# Patient Record
Sex: Female | Born: 1965 | Race: Black or African American | Hispanic: No | Marital: Single | Smoking: Never smoker
Health system: Southern US, Community
[De-identification: ages and names within clinical notes are randomized; demographics above are authoritative.]

## PROBLEM LIST (undated history)

## (undated) DIAGNOSIS — Z9109 Other allergy status, other than to drugs and biological substances: Secondary | ICD-10-CM

## (undated) DIAGNOSIS — T7840XA Allergy, unspecified, initial encounter: Secondary | ICD-10-CM

## (undated) DIAGNOSIS — R51 Headache: Secondary | ICD-10-CM

## (undated) DIAGNOSIS — I10 Essential (primary) hypertension: Secondary | ICD-10-CM

## (undated) DIAGNOSIS — M729 Fibroblastic disorder, unspecified: Secondary | ICD-10-CM

## (undated) HISTORY — PX: COLONOSCOPY: SHX174

## (undated) HISTORY — DX: Essential (primary) hypertension: I10

## (undated) HISTORY — DX: Allergy, unspecified, initial encounter: T78.40XA

## (undated) HISTORY — DX: Fibroblastic disorder, unspecified: M72.9

## (undated) HISTORY — PX: BELPHAROPTOSIS REPAIR: SHX369

## (undated) HISTORY — DX: Other allergy status, other than to drugs and biological substances: Z91.09

## (undated) HISTORY — PX: KNEE SURGERY: SHX244

## (undated) HISTORY — DX: Headache: R51

---

## 1997-08-15 ENCOUNTER — Ambulatory Visit (HOSPITAL_COMMUNITY): Admission: RE | Admit: 1997-08-15 | Discharge: 1997-08-15 | Payer: Self-pay | Admitting: Gynecology

## 1998-05-05 ENCOUNTER — Other Ambulatory Visit: Admission: RE | Admit: 1998-05-05 | Discharge: 1998-05-05 | Payer: Self-pay | Admitting: Gynecology

## 1999-07-22 ENCOUNTER — Other Ambulatory Visit: Admission: RE | Admit: 1999-07-22 | Discharge: 1999-07-22 | Payer: Self-pay | Admitting: Gynecology

## 2000-07-24 ENCOUNTER — Other Ambulatory Visit: Admission: RE | Admit: 2000-07-24 | Discharge: 2000-07-24 | Payer: Self-pay | Admitting: Gynecology

## 2001-01-16 ENCOUNTER — Encounter: Payer: Self-pay | Admitting: Occupational Medicine

## 2001-01-16 ENCOUNTER — Encounter: Admission: RE | Admit: 2001-01-16 | Discharge: 2001-01-16 | Payer: Self-pay | Admitting: Occupational Medicine

## 2001-08-14 ENCOUNTER — Other Ambulatory Visit: Admission: RE | Admit: 2001-08-14 | Discharge: 2001-08-14 | Payer: Self-pay | Admitting: Gynecology

## 2002-08-20 ENCOUNTER — Other Ambulatory Visit: Admission: RE | Admit: 2002-08-20 | Discharge: 2002-08-20 | Payer: Self-pay | Admitting: Gynecology

## 2003-10-23 ENCOUNTER — Other Ambulatory Visit: Admission: RE | Admit: 2003-10-23 | Discharge: 2003-10-23 | Payer: Self-pay | Admitting: Gynecology

## 2004-08-06 ENCOUNTER — Ambulatory Visit: Payer: Self-pay | Admitting: Family Medicine

## 2004-08-13 ENCOUNTER — Ambulatory Visit: Payer: Self-pay | Admitting: Family Medicine

## 2004-08-18 ENCOUNTER — Encounter: Admission: RE | Admit: 2004-08-18 | Discharge: 2004-08-18 | Payer: Self-pay | Admitting: Occupational Medicine

## 2004-11-15 ENCOUNTER — Other Ambulatory Visit: Admission: RE | Admit: 2004-11-15 | Discharge: 2004-11-15 | Payer: Self-pay | Admitting: Gynecology

## 2004-12-06 ENCOUNTER — Ambulatory Visit: Payer: Self-pay | Admitting: Family Medicine

## 2005-10-07 ENCOUNTER — Ambulatory Visit: Payer: Self-pay | Admitting: Family Medicine

## 2005-10-13 ENCOUNTER — Ambulatory Visit: Payer: Self-pay | Admitting: Family Medicine

## 2005-10-31 ENCOUNTER — Encounter: Admission: RE | Admit: 2005-10-31 | Discharge: 2005-10-31 | Payer: Self-pay | Admitting: Occupational Medicine

## 2007-02-08 ENCOUNTER — Ambulatory Visit: Payer: Self-pay | Admitting: Family Medicine

## 2007-02-08 DIAGNOSIS — N6019 Diffuse cystic mastopathy of unspecified breast: Secondary | ICD-10-CM | POA: Insufficient documentation

## 2007-02-19 ENCOUNTER — Encounter: Admission: RE | Admit: 2007-02-19 | Discharge: 2007-02-19 | Payer: Self-pay | Admitting: Family Medicine

## 2007-10-02 ENCOUNTER — Emergency Department (HOSPITAL_COMMUNITY): Admission: EM | Admit: 2007-10-02 | Discharge: 2007-10-02 | Payer: Self-pay | Admitting: Emergency Medicine

## 2008-01-16 ENCOUNTER — Ambulatory Visit: Payer: Self-pay | Admitting: Family Medicine

## 2008-01-16 LAB — CONVERTED CEMR LAB
Albumin: 3.7 g/dL (ref 3.5–5.2)
BUN: 8 mg/dL (ref 6–23)
Calcium: 9 mg/dL (ref 8.4–10.5)
Eosinophils Absolute: 0.2 10*3/uL (ref 0.0–0.7)
Eosinophils Relative: 2.7 % (ref 0.0–5.0)
GFR calc Af Amer: 118 mL/min
GFR calc non Af Amer: 98 mL/min
Glucose, Bld: 86 mg/dL (ref 70–99)
Glucose, Urine, Semiquant: NEGATIVE
HCT: 38.3 % (ref 36.0–46.0)
HDL: 57.8 mg/dL (ref >39.0)
Hemoglobin: 12.7 g/dL (ref 12.0–15.0)
Ketones, urine, test strip: NEGATIVE
MCV: 86.8 fL (ref 78.0–100.0)
Monocytes Absolute: 0.8 10*3/uL (ref 0.1–1.0)
Monocytes Relative: 10.8 % (ref 3.0–12.0)
Neutro Abs: 4.3 10*3/uL (ref 1.4–7.7)
Nitrite: NEGATIVE
RDW: 13.3 % (ref 11.5–14.6)
Specific Gravity, Urine: >=1.03
TSH: 1.68 microintl units/mL (ref 0.35–5.50)
Total CHOL/HDL Ratio: 2.8
Total Protein: 7.3 g/dL (ref 6.0–8.3)
Triglycerides: 44 mg/dL (ref 0–149)
WBC: 7.4 10*3/uL (ref 4.5–10.5)
pH: 5.5

## 2008-01-22 ENCOUNTER — Ambulatory Visit: Payer: Self-pay | Admitting: Family Medicine

## 2009-01-15 ENCOUNTER — Ambulatory Visit: Payer: Self-pay | Admitting: Family Medicine

## 2009-01-15 LAB — CONVERTED CEMR LAB
ALT: 13 units/L (ref 0–35)
BUN: 10 mg/dL (ref 6–23)
Basophils Relative: 0.4 % (ref 0.0–3.0)
Bilirubin Urine: NEGATIVE
CO2: 28 meq/L (ref 19–32)
Chloride: 107 meq/L (ref 96–112)
Cholesterol: 162 mg/dL (ref 0–200)
Eosinophils Relative: 2.9 % (ref 0.0–5.0)
Glucose, Urine, Semiquant: NEGATIVE
HCT: 38.6 % (ref 36.0–46.0)
LDL Cholesterol: 100 mg/dL — ABNORMAL HIGH (ref 0–99)
Lymphs Abs: 1.8 10*3/uL (ref 0.7–4.0)
MCV: 86.9 fL (ref 78.0–100.0)
Monocytes Absolute: 0.6 10*3/uL (ref 0.1–1.0)
Platelets: 266 10*3/uL (ref 150.0–400.0)
Potassium: 4.1 meq/L (ref 3.5–5.1)
Specific Gravity, Urine: 1.02
TSH: 1.59 microintl units/mL (ref 0.35–5.50)
Total Bilirubin: 0.9 mg/dL (ref 0.3–1.2)
Total Protein: 6.8 g/dL (ref 6.0–8.3)
Urobilinogen, UA: 1
WBC: 6.7 10*3/uL (ref 4.5–10.5)
pH: 7

## 2009-02-16 ENCOUNTER — Ambulatory Visit: Payer: Self-pay | Admitting: Family Medicine

## 2009-03-16 ENCOUNTER — Encounter: Admission: RE | Admit: 2009-03-16 | Discharge: 2009-03-16 | Payer: Self-pay | Admitting: Gynecology

## 2009-06-12 ENCOUNTER — Emergency Department (HOSPITAL_COMMUNITY): Admission: EM | Admit: 2009-06-12 | Discharge: 2009-06-12 | Payer: Self-pay | Admitting: Emergency Medicine

## 2010-02-11 ENCOUNTER — Ambulatory Visit: Payer: Self-pay | Admitting: Family Medicine

## 2010-02-11 LAB — CONVERTED CEMR LAB
AST: 20 units/L (ref 0–37)
Albumin: 3.7 g/dL (ref 3.5–5.2)
Basophils Absolute: 0.1 10*3/uL (ref 0.0–0.1)
CO2: 26 meq/L (ref 19–32)
GFR calc non Af Amer: 107.54 mL/min (ref >60)
Glucose, Bld: 85 mg/dL (ref 70–99)
Glucose, Urine, Semiquant: NEGATIVE
HCT: 35.9 % — ABNORMAL LOW (ref 36.0–46.0)
Hemoglobin: 11.8 g/dL — ABNORMAL LOW (ref 12.0–15.0)
Lymphs Abs: 2.5 10*3/uL (ref 0.7–4.0)
MCHC: 32.9 g/dL (ref 30.0–36.0)
Monocytes Relative: 10.3 % (ref 3.0–12.0)
Neutro Abs: 3.2 10*3/uL (ref 1.4–7.7)
Potassium: 3.8 meq/L (ref 3.5–5.1)
RDW: 13.8 % (ref 11.5–14.6)
Sodium: 136 meq/L (ref 135–145)
Specific Gravity, Urine: 1.02
TSH: 1.9 microintl units/mL (ref 0.35–5.50)
Total Protein: 6.8 g/dL (ref 6.0–8.3)
WBC Urine, dipstick: NEGATIVE
pH: 7

## 2010-02-23 ENCOUNTER — Encounter: Payer: Self-pay | Admitting: Family Medicine

## 2010-02-23 ENCOUNTER — Ambulatory Visit: Payer: Self-pay | Admitting: Family Medicine

## 2010-02-23 DIAGNOSIS — R030 Elevated blood-pressure reading, without diagnosis of hypertension: Secondary | ICD-10-CM | POA: Insufficient documentation

## 2010-03-10 ENCOUNTER — Ambulatory Visit: Payer: Self-pay | Admitting: Family Medicine

## 2010-04-18 ENCOUNTER — Encounter: Payer: Self-pay | Admitting: Gynecology

## 2010-04-18 ENCOUNTER — Encounter: Payer: Self-pay | Admitting: Occupational Medicine

## 2010-04-27 NOTE — Assessment & Plan Note (Signed)
Summary: CPX/NO PAP/CJR   Vital Signs:  Patient profile:   45 year old female Height:      65.5 inches Weight:      155 pounds BMI:     25.49 Temp:     98.3 degrees F oral BP sitting:   140 / 90  (left arm) Cuff size:   regular  Vitals Entered By: Kern Reap CMA Duncan Dull) (February 23, 2010 4:27 PM)  History of Present Illness: Carla Jacobson is a 45 year old, married female, nonsmoker, who comes in today for physical evaluation.  She has a number of concerns today, including her blood pressure.................. BP at home 140/90... BP by me 140/70 right arm sitting position, however, her mother and father both have hypertension.  She has intermittent migraine headaches.  She's concerned about her weight 155 pounds.  She wants to know about, calcium, vitamin D, and aspirin.  She has some tendinitis in her right arm and some low back pain.  Review of systems otherwise negative By GYN.  A month ago, normal.  Annual mammogram.  Normal 2010  Tetanus 2006 seasonal flu shot 2011  Allergies: No Known Drug Allergies  Review of Systems      See HPI  Physical Exam  General:  Well-developed,well-nourished,in no acute distress; alert,appropriate and cooperative throughout examination Head:  Normocephalic and atraumatic without obvious abnormalities. No apparent alopecia or balding. Eyes:  No corneal or conjunctival inflammation noted. EOMI. Perrla. Funduscopic exam benign, without hemorrhages, exudates or papilledema. Vision grossly normal. Ears:  External ear exam shows no significant lesions or deformities.  Otoscopic examination reveals clear canals, tympanic membranes are intact bilaterally without bulging, retraction, inflammation or discharge. Hearing is grossly normal bilaterally. Nose:  External nasal examination shows no deformity or inflammation. Nasal mucosa are pink and moist without lesions or exudates. Mouth:  Oral mucosa and oropharynx without lesions or exudates.  Teeth in good  repair. Neck:  No deformities, masses, or tenderness noted. Chest Wall:  No deformities, masses, or tenderness noted. Breasts:  No mass, nodules, thickening, tenderness, bulging, retraction, inflamation, nipple discharge or skin changes noted.   Lungs:  Normal respiratory effort, chest expands symmetrically. Lungs are clear to auscultation, no crackles or wheezes. Heart:  Normal rate and regular rhythm. S1 and S2 normal without gallop, murmur, click, rub or other extra sounds. Abdomen:  Bowel sounds positive,abdomen soft and non-tender without masses, organomegaly or hernias noted. Msk:  No deformity or scoliosis noted of thoracic or lumbar spine.   Pulses:  R and L carotid,radial,femoral,dorsalis pedis and posterior tibial pulses are full and equal bilaterally Extremities:  No clubbing, cyanosis, edema, or deformity noted with normal full range of motion of all joints.   Neurologic:  No cranial nerve deficits noted. Station and gait are normal. Plantar reflexes are down-going bilaterally. DTRs are symmetrical throughout. Sensory, motor and coordinative functions appear intact. Skin:  Intact without suspicious lesions or rashes Cervical Nodes:  No lymphadenopathy noted Axillary Nodes:  No palpable lymphadenopathy Inguinal Nodes:  No significant adenopathy Psych:  Cognition and judgment appear intact. Alert and cooperative with normal attention span and concentration. No apparent delusions, illusions, hallucinations   Impression & Recommendations:  Problem # 1:  HEALTH SCREENING (ICD-V70.0) Assessment Unchanged  Orders: EKG w/ Interpretation (93000)  Problem # 2:  FIBROCYSTIC BREAST DISEASE (ICD-610.1) Assessment: Unchanged  Problem # 3:  ELEVATED BLOOD PRESSURE WITHOUT DIAGNOSIS OF HYPERTENSION (ICD-796.2) Assessment: New  Orders: EKG w/ Interpretation (93000)  Patient Instructions: 1)  take Motrin, 600 mg 3  times a day for your headaches, and/or D. tendinitis on her right elbow  and low back pain. 2)   salt free diet.!!!!!!!!!!!!!!!! 3)  Check a morning blood pressure daily.  Return in two weeks with the data and the device. 4)  Walk 30 minutes daily. 5)  Schedule your mammogram./BSE monthly 6)  Take an Aspirin every day.   Orders Added: 1)  Est. Patient 40-64 years [99396] 2)  EKG w/ Interpretation [93000]   Immunization History:  Influenza Immunization History:    Influenza:  historical (12/26/2009)   Immunization History:  Influenza Immunization History:    Influenza:  Historical (12/26/2009)
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
## 2010-06-20 LAB — URINALYSIS, ROUTINE W REFLEX MICROSCOPIC
Bilirubin Urine: NEGATIVE
Ketones, ur: NEGATIVE mg/dL
Nitrite: NEGATIVE
Protein, ur: NEGATIVE mg/dL
Specific Gravity, Urine: 1.01 (ref 1.005–1.030)
Urobilinogen, UA: 0.2 mg/dL (ref 0.0–1.0)

## 2010-06-20 LAB — BASIC METABOLIC PANEL
BUN: 9 mg/dL (ref 6–23)
CO2: 23 mEq/L (ref 19–32)
Chloride: 106 mEq/L (ref 96–112)
Glucose, Bld: 147 mg/dL — ABNORMAL HIGH (ref 70–99)
Potassium: 3.3 mEq/L — ABNORMAL LOW (ref 3.5–5.1)

## 2010-06-20 LAB — CBC
HCT: 40.7 % (ref 36.0–46.0)
Hemoglobin: 13.1 g/dL (ref 12.0–15.0)
MCHC: 32.2 g/dL (ref 30.0–36.0)
MCV: 86.4 fL (ref 78.0–100.0)
Platelets: 270 10*3/uL (ref 150–400)
RDW: 13.9 % (ref 11.5–15.5)

## 2010-06-20 LAB — DIFFERENTIAL
Basophils Absolute: 0.1 10*3/uL (ref 0.0–0.1)
Eosinophils Absolute: 0.1 10*3/uL (ref 0.0–0.7)
Eosinophils Relative: 1 % (ref 0–5)
Monocytes Absolute: 0.6 10*3/uL (ref 0.1–1.0)

## 2010-06-20 LAB — POCT PREGNANCY, URINE: Preg Test, Ur: NEGATIVE

## 2010-06-20 LAB — URINE MICROSCOPIC-ADD ON

## 2010-12-23 LAB — URINALYSIS, ROUTINE W REFLEX MICROSCOPIC
Bilirubin Urine: NEGATIVE
Glucose, UA: NEGATIVE
Protein, ur: NEGATIVE
Urobilinogen, UA: 1

## 2010-12-23 LAB — URINE MICROSCOPIC-ADD ON

## 2011-11-24 ENCOUNTER — Other Ambulatory Visit: Payer: Self-pay | Admitting: Gynecology

## 2011-11-24 DIAGNOSIS — Z1231 Encounter for screening mammogram for malignant neoplasm of breast: Secondary | ICD-10-CM

## 2011-12-13 ENCOUNTER — Ambulatory Visit
Admission: RE | Admit: 2011-12-13 | Discharge: 2011-12-13 | Disposition: A | Discharge status: Home / Self Care | Payer: Federal, State, Local not specified - PPO | Source: Ambulatory Visit | Attending: Gynecology | Admitting: Gynecology

## 2011-12-13 DIAGNOSIS — Z1231 Encounter for screening mammogram for malignant neoplasm of breast: Secondary | ICD-10-CM

## 2012-03-16 ENCOUNTER — Other Ambulatory Visit (INDEPENDENT_AMBULATORY_CARE_PROVIDER_SITE_OTHER): Payer: Federal, State, Local not specified - PPO

## 2012-03-16 ENCOUNTER — Ambulatory Visit (INDEPENDENT_AMBULATORY_CARE_PROVIDER_SITE_OTHER): Payer: Federal, State, Local not specified - PPO | Admitting: *Deleted

## 2012-03-16 ENCOUNTER — Other Ambulatory Visit: Payer: Federal, State, Local not specified - PPO

## 2012-03-16 DIAGNOSIS — Z0000 Encounter for general adult medical examination without abnormal findings: Secondary | ICD-10-CM

## 2012-03-16 DIAGNOSIS — Z23 Encounter for immunization: Secondary | ICD-10-CM

## 2012-03-16 LAB — CBC WITH DIFFERENTIAL/PLATELET
Basophils Relative: 0.5 % (ref 0.0–3.0)
Eosinophils Absolute: 0.3 10*3/uL (ref 0.0–0.7)
Eosinophils Relative: 4 % (ref 0.0–5.0)
HCT: 38.5 % (ref 36.0–46.0)
Lymphs Abs: 2.4 10*3/uL (ref 0.7–4.0)
MCHC: 32.9 g/dL (ref 30.0–36.0)
MCV: 85.2 fl (ref 78.0–100.0)
Monocytes Absolute: 0.8 10*3/uL (ref 0.1–1.0)
Neutro Abs: 4.7 10*3/uL (ref 1.4–7.7)
Neutrophils Relative %: 56.1 % (ref 43.0–77.0)
RBC: 4.52 Mil/uL (ref 3.87–5.11)
WBC: 8.3 10*3/uL (ref 4.5–10.5)

## 2012-03-16 LAB — TSH: TSH: 1.65 u[IU]/mL (ref 0.35–5.50)

## 2012-03-16 LAB — BASIC METABOLIC PANEL
CO2: 26 mEq/L (ref 19–32)
Chloride: 107 mEq/L (ref 96–112)
Creatinine, Ser: 0.8 mg/dL (ref 0.4–1.2)
Potassium: 3.8 mEq/L (ref 3.5–5.1)

## 2012-03-16 LAB — POCT URINALYSIS DIPSTICK
Ketones, UA: NEGATIVE
Leukocytes, UA: NEGATIVE
Nitrite, UA: NEGATIVE
Protein, UA: NEGATIVE
Urobilinogen, UA: 0.2
pH, UA: 7.5

## 2012-03-16 LAB — HEPATIC FUNCTION PANEL
ALT: 13 U/L (ref 0–35)
Albumin: 3.8 g/dL (ref 3.5–5.2)
Total Protein: 7.1 g/dL (ref 6.0–8.3)

## 2012-03-16 LAB — LIPID PANEL
Cholesterol: 156 mg/dL (ref 0–200)
VLDL: 7.2 mg/dL (ref 0.0–40.0)

## 2012-03-26 ENCOUNTER — Encounter: Payer: Federal, State, Local not specified - PPO | Admitting: Family Medicine

## 2012-04-02 ENCOUNTER — Encounter: Payer: Federal, State, Local not specified - PPO | Admitting: Family Medicine

## 2012-04-12 ENCOUNTER — Encounter: Payer: Self-pay | Admitting: Family Medicine

## 2012-04-12 ENCOUNTER — Ambulatory Visit (INDEPENDENT_AMBULATORY_CARE_PROVIDER_SITE_OTHER): Payer: Federal, State, Local not specified - PPO | Admitting: Family Medicine

## 2012-04-12 VITALS — BP 120/88 | Temp 98.4°F | Ht 66.5 in | Wt 165.0 lb

## 2012-04-12 DIAGNOSIS — N6019 Diffuse cystic mastopathy of unspecified breast: Secondary | ICD-10-CM

## 2012-04-12 DIAGNOSIS — N959 Unspecified menopausal and perimenopausal disorder: Secondary | ICD-10-CM

## 2012-04-12 DIAGNOSIS — Z8371 Family history of colonic polyps: Secondary | ICD-10-CM

## 2012-04-12 DIAGNOSIS — M722 Plantar fascial fibromatosis: Secondary | ICD-10-CM

## 2012-04-12 NOTE — Progress Notes (Signed)
  Subjective:    Patient ID: Carla Jacobson, female    DOB: [birth year], 47 y.o.   MRN: [id number]  HPI Carla Jacobson is a 47 year old married female nonsmoker who comes in today for general physical examination  She's always been in excellent health he has no chronic health problems and takes no medication on a regular basis. LMP was in December normal she does have some perimenopausal symptoms with hot flashes. For birth control but using condoms.  She's had some pain in the ventral portion of her left foot for about 4 weeks. No history of trauma  She gets routine eye care, dental care, and you mammography, does not do BSE monthly, colonoscopy should be done now because her mother had a history of colon polyps.  Social history she has worked for the [employer] They're closing the [place] office and moving to [facility] or [place]   Review of Systems  Constitutional: Negative.   HENT: Negative.   Eyes: Negative.   Respiratory: Negative.   Cardiovascular: Negative.   Gastrointestinal: Negative.   Genitourinary: Negative.   Musculoskeletal: Negative.   Neurological: Negative.   Hematological: Negative.   Psychiatric/Behavioral: Negative.        Objective:   Physical Exam  Constitutional: She appears well-developed and well-nourished.  HENT:  Head: Normocephalic and atraumatic.  Right Ear: External ear normal.  Left Ear: External ear normal.  Nose: Nose normal.  Mouth/Throat: Oropharynx is clear and moist.  Eyes: EOM are normal. Pupils are equal, round, and reactive to light.  Neck: Normal range of motion. Neck supple. No thyromegaly present.  Cardiovascular: Normal rate, regular rhythm, normal heart sounds and intact distal pulses.  Exam reveals no gallop and no friction rub.   No murmur heard. Pulmonary/Chest: Effort normal and breath sounds normal.  Abdominal: Soft. Bowel sounds are normal. She exhibits no distension and no mass. There is no  tenderness. There is no rebound.  Genitourinary:       Bilateral breast exam normal,,,,,,, she has multiple fibrocystic changes throughout both breasts advised and showed how to do BSE monthly  Musculoskeletal: Normal range of motion.  Lymphadenopathy:    She has no cervical adenopathy.  Neurological: She is alert. She has normal reflexes. No cranial nerve deficit. She exhibits normal muscle tone. Coordination normal.  Skin: Skin is warm and dry.  Psychiatric: She has a normal mood and affect. Her behavior is normal. Judgment and thought content normal.          Assessment & Plan:  Healthy female  Fibrocystic breast changes recommended BSE monthly and annual mammography  Perimenopausal followed by GYN  Plantar fasciitis left foot Motrin 600 twice a day with food

## 2012-04-12 NOTE — Patient Instructions (Addendum)
Motrin 600 mg twice daily with food and cotton socks and sneakers for the fasciitis of your left foot  Followup in one year for general physical examination sooner if any problems  Do a thorough breast exam monthly as outlined  You will get a call from GI to review how a colonoscopy is done

## 2012-04-17 ENCOUNTER — Encounter (HOSPITAL_COMMUNITY): Payer: Self-pay | Admitting: Emergency Medicine

## 2012-04-17 ENCOUNTER — Emergency Department (HOSPITAL_COMMUNITY)
Admission: EM | Admit: 2012-04-17 | Discharge: 2012-04-17 | Disposition: A | Discharge status: Home / Self Care | Payer: Federal, State, Local not specified - PPO | Attending: Emergency Medicine | Admitting: Emergency Medicine

## 2012-04-17 DIAGNOSIS — M436 Torticollis: Secondary | ICD-10-CM

## 2012-04-17 DIAGNOSIS — M542 Cervicalgia: Secondary | ICD-10-CM | POA: Insufficient documentation

## 2012-04-17 MED ORDER — NAPROXEN 500 MG PO TABS: 500.0000 mg | ORAL_TABLET | Freq: Two times a day (BID) | ORAL | Status: DC

## 2012-04-17 MED ORDER — HYDROCODONE-ACETAMINOPHEN 5-325 MG PO TABS: ORAL_TABLET | ORAL | Status: DC

## 2012-04-17 MED ORDER — KETOROLAC TROMETHAMINE 60 MG/2ML IM SOLN
60.0000 mg | Freq: Once | INTRAMUSCULAR | Status: AC
  Administered 2012-04-17: 60 mg via INTRAMUSCULAR
  Filled 2012-04-17: qty 2

## 2012-04-17 MED ORDER — METHOCARBAMOL 500 MG PO TABS: 1000.0000 mg | ORAL_TABLET | Freq: Four times a day (QID) | ORAL | Status: DC
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
## 2012-04-17 NOTE — ED Notes (Signed)
Pt reports "stiff neck" x 3 days, unresponsive to OTC meds or heat. Denies trauma

## 2012-04-17 NOTE — ED Provider Notes (Signed)
Medical screening examination/treatment/procedure(s) were performed by non-physician practitioner and as supervising physician I was immediately available for consultation/collaboration.  Gerhard Munch, MD 04/17/12 1651

## 2012-05-15 ENCOUNTER — Encounter: Payer: Federal, State, Local not specified - PPO | Admitting: Family Medicine

## 2012-07-24 ENCOUNTER — Encounter: Payer: Self-pay | Admitting: Family Medicine

## 2012-07-24 ENCOUNTER — Ambulatory Visit (INDEPENDENT_AMBULATORY_CARE_PROVIDER_SITE_OTHER): Payer: Federal, State, Local not specified - PPO | Admitting: Family Medicine

## 2012-07-24 ENCOUNTER — Telehealth: Payer: Self-pay | Admitting: Family Medicine

## 2012-07-24 VITALS — BP 140/90 | Temp 98.4°F | Wt 164.0 lb

## 2012-07-24 DIAGNOSIS — O10019 Pre-existing essential hypertension complicating pregnancy, unspecified trimester: Secondary | ICD-10-CM | POA: Insufficient documentation

## 2012-07-24 MED ORDER — CHLORTHALIDONE 25 MG PO TABS: 25.0000 mg | ORAL_TABLET | Freq: Every day | ORAL | Status: DC

## 2012-07-24 NOTE — Progress Notes (Signed)
  Subjective:    Patient ID: Carla Jacobson, female    DOB: September 22, 1965, 47 y.o.   MRN: [id number]  HPI Carla Jacobson is a 47 year old married female nonsmoker who comes in today for followup of elevated blood pressure  She's had episodes in the past for her blood pressures, but has never been sustained. Recently she's been checking her BP daily for the past month and are all elevated.  Her mother father and brother all have hypertension   Review of Systems    review of systems negative Objective:   Physical Exam Well-developed well-nourished female no acute distress BP right arm sitting position 150/90       Assessment & Plan:  New onset hypertension plan begin a diuretic BP check daily followup in

## 2012-07-24 NOTE — Patient Instructions (Addendum)
Begin D. Chlorthalidone 25 mg,,,,,,,,,,,,, one tablet daily in the morning  Salt free diet  Walk 30 minutes daily  Check your blood pressure daily in the morning right arm sitting position  Return in 4 weeks for followup

## 2012-07-24 NOTE — Telephone Encounter (Signed)
Patient Information:  Caller Name: Keondra  Phone: [phone]  Patient: Carla Jacobson, Carla Jacobson  Gender: Female  DOB: Jan 27, 1966  Age: 47 Years  PCP: Kelle Darting [facility])  Pregnant: No  Office Follow Up:  Does the office need to follow up with this patient?: No  Instructions For The Office: N/A   Symptoms  Reason For Call & Symptoms: Patient states she having issues with her blood pressure. Onset in March , 2014. Blood pressure 150/97 today.    In the last week- 165/105.   Her father has history of HTN/stroke.  Reviewed Health History In EMR: Yes  Reviewed Medications In EMR: Yes  Reviewed Allergies In EMR: Yes  Reviewed Surgeries / Procedures: Yes  Date of Onset of Symptoms: 06/12/2012 OB / GYN:  LMP: Unknown  Guideline(s) Used:  High Blood Pressure  Disposition Per Guideline:   See Within 2 Weeks in Office  Reason For Disposition Reached:   BP > 140/90 and is not taking BP medications  Advice Given:  General:  Untreated high blood pressure may cause damage to the heart, brain, kidneys, and eyes.  Treatment of high blood pressure can reduce the risk of stroke, heart attack, and heart failure.  The goal of blood pressure treatment for most patients with hypertension is to keep the blood pressure under 140/90.  BP less than 120 / 80   This is considered normal blood pressure  Lifestyle Changes  Maintain a healthy weight. Lose weight if you are overweight.  Do 30 minutes of aerobic physical activity (e.g., brisk walking) most days of the week.  Eat a diet high in fresh fruits and low-fat dairy products. Limit your intake of saturated and total fat. Choose foods that are lower in salt.  If you smoke, you should stop.  If you drink alcohol, you should limit your daily alcohol drinking. Women should have no more than one drink per day. Men should have no more than 2 drinks per day. A drink is defined as 1.5 oz hard liquor (one shot or jigger; 45 ml), 5 oz wine (small  glass; 150 ml), or 12 oz beer (one can; 360 ml).  Call Back If:  Headache, blurred vision, difficulty talking, or difficulty walking occurs  Chest pain or difficulty breathing occurs  You want to go in to the office for a blood pressure check  You become worse.  Patient Will Follow Care Advice:  YES  Appointment Scheduled:  07/24/2012 16:30:00 Appointment Scheduled Provider:  Kelle Darting [facility])

## 2012-08-23 ENCOUNTER — Encounter: Payer: Self-pay | Admitting: *Deleted

## 2012-08-23 ENCOUNTER — Encounter: Payer: Self-pay | Admitting: Family Medicine

## 2012-08-23 ENCOUNTER — Ambulatory Visit (INDEPENDENT_AMBULATORY_CARE_PROVIDER_SITE_OTHER): Payer: Federal, State, Local not specified - PPO | Admitting: Family Medicine

## 2012-08-23 VITALS — BP 120/90 | Temp 98.3°F | Wt 164.0 lb

## 2012-08-23 DIAGNOSIS — I10 Essential (primary) hypertension: Secondary | ICD-10-CM

## 2012-08-23 NOTE — Patient Instructions (Signed)
Continue the chlorthalidone 25 mg,,,,,,,,,,, one tablet daily in the morning  Check your blood pressure weekly visits back to normal  If per chance she get an elevated reading then check it daily for 2 weeks in a row  If after 2 weeks u  Look and  your blood pressures are all normal then continue to one tablet daily  If after 2 weeks her blood pressures are not normal,,,,,,,,,,, 135/85 or less,,,,,,,,, them return to see [place] with the data and the device for reevaluation  Return next November for your annual physical examination labs one week prior

## 2012-08-23 NOTE — Progress Notes (Signed)
  Subjective:    Patient ID: Carla Jacobson, female    DOB: 1965/08/03, 47 y.o.   MRN: [id number]  HPI Carla Jacobson is a delightful 47 year old female married nonsmoker who comes in today for followup of hypertension  We started her on chlorthalidone 25 mg daily BP 120/80 right arm sitting position same at home and digital cuff   Review of Systems Review of systems otherwise negative no side effects to medication    Objective:   Physical Exam Well-developed well-nourished female no acute distress BP right arm sitting position 130/80 pulse 70 and regular       Assessment & Plan:  Hypertension at goal continue chlorthalidone daily monitor blood pressure return yearly when necessary

## 2012-09-21 ENCOUNTER — Encounter: Payer: Self-pay | Admitting: Internal Medicine

## 2012-11-08 ENCOUNTER — Ambulatory Visit (AMBULATORY_SURGERY_CENTER): Payer: Federal, State, Local not specified - PPO

## 2012-11-08 ENCOUNTER — Encounter: Payer: Self-pay | Admitting: Internal Medicine

## 2012-11-08 VITALS — Ht 66.5 in | Wt 163.2 lb

## 2012-11-08 DIAGNOSIS — Z8371 Family history of colonic polyps: Secondary | ICD-10-CM

## 2012-11-08 DIAGNOSIS — Z83719 Family history of colon polyps, unspecified: Secondary | ICD-10-CM

## 2012-11-08 MED ORDER — MOVIPREP 100 G PO SOLR: 1.0000 | Freq: Once | ORAL | Status: DC

## 2012-11-22 ENCOUNTER — Ambulatory Visit (AMBULATORY_SURGERY_CENTER): Payer: Federal, State, Local not specified - PPO | Admitting: Internal Medicine

## 2012-11-22 ENCOUNTER — Encounter: Payer: Self-pay | Admitting: Internal Medicine

## 2012-11-22 VITALS — BP 109/74 | HR 63 | Temp 97.6°F | Resp 19 | Ht 66.0 in | Wt 163.0 lb

## 2012-11-22 DIAGNOSIS — Z1211 Encounter for screening for malignant neoplasm of colon: Secondary | ICD-10-CM

## 2012-11-22 DIAGNOSIS — Z8371 Family history of colonic polyps: Secondary | ICD-10-CM

## 2012-11-22 MED ORDER — SODIUM CHLORIDE 0.9 % IV SOLN: 500.0000 mL | INTRAVENOUS | Status: DC

## 2012-11-22 NOTE — Patient Instructions (Addendum)

## 2012-11-22 NOTE — Progress Notes (Signed)
Patient did not experience any of the following events: a burn prior to discharge; a fall within the facility; wrong site/side/patient/procedure/implant event; or a hospital transfer or hospital admission upon discharge from the facility. (G8907) Patient did not have preoperative order for IV antibiotic SSI prophylaxis. (G8918)  

## 2012-11-22 NOTE — Op Note (Signed)
[facility] [address] [place], [address]   COLONOSCOPY PROCEDURE REPORT  PATIENT: Carla, Jacobson  MR#: [id number] BIRTHDATE: 01-15-1966 , 47  yrs. old GENDER: Female ENDOSCOPIST: Roxy Cedar, MD REFERRED YQ:[id number] Shawnie Dapper, M.D. PROCEDURE DATE:  11/22/2012 PROCEDURE:   Colonoscopy, screening First Screening Colonoscopy - Avg.  risk and is 50 yrs.  old or older - No.  Prior Negative Screening - Now for repeat screening. N/A  History of Adenoma - Now for follow-up colonoscopy & has been > or = to 3 yrs.  N/A  Polyps Removed Today? No.  Recommend repeat exam, <10 yrs? No. ASA CLASS:   Class II INDICATIONS:Patient's family history of colon polyps. MEDICATIONS: MAC sedation, administered by CRNA and propofol (Diprivan) 250mg  IV  DESCRIPTION OF PROCEDURE:   After the risks benefits and alternatives of the procedure were thoroughly explained, informed consent was obtained.  A digital rectal exam revealed no abnormalities of the rectum.   The LB [id number]  endoscope was introduced through the anus and advanced to the cecum, which was identified by both the appendix and ileocecal valve. No adverse events experienced.   The quality of the prep was excellent, using MoviPrep  The instrument was then slowly withdrawn as the colon was fully examined.  COLON FINDINGS: A normal appearing cecum, ileocecal valve, and appendiceal orifice were identified.  The ascending, hepatic flexure, transverse, splenic flexure, descending, sigmoid colon and rectum appeared unremarkable.  No polyps or cancers were seen. Retroflexed views revealed no abnormalities. The time to cecum=3 minutes 06 seconds.  Withdrawal time=8 minutes 45 seconds.  The scope was withdrawn and the procedure completed.  COMPLICATIONS: There were no complications.  ENDOSCOPIC IMPRESSION: 1. Normal colon  RECOMMENDATIONS: 1. Continue current colorectal screening recommendations for "routine  risk" patients with a repeat colonoscopy in 10 years.   eSigned:  Roxy Cedar, MD 11/22/2012 11:55 AM   cc: Roderick Pee, MD and The Patient

## 2012-11-22 NOTE — Progress Notes (Signed)
Lidocaine-40mg IV prior to Propofol InductionPropofol given over incremental dosages 

## 2012-11-23 ENCOUNTER — Telehealth: Payer: Self-pay | Admitting: *Deleted

## 2012-11-23 NOTE — Telephone Encounter (Signed)
Reached a voice mailbox that has not been set up yet,unable to leave message on f/u call

## 2013-01-31 ENCOUNTER — Other Ambulatory Visit: Payer: Self-pay

## 2013-04-03 ENCOUNTER — Other Ambulatory Visit (INDEPENDENT_AMBULATORY_CARE_PROVIDER_SITE_OTHER): Payer: Federal, State, Local not specified - PPO

## 2013-04-03 DIAGNOSIS — Z0000 Encounter for general adult medical examination without abnormal findings: Secondary | ICD-10-CM

## 2013-04-03 LAB — POCT URINALYSIS DIPSTICK
Bilirubin, UA: NEGATIVE
Glucose, UA: NEGATIVE
KETONES UA: NEGATIVE
Leukocytes, UA: NEGATIVE
Nitrite, UA: NEGATIVE
Spec Grav, UA: 1.02
Urobilinogen, UA: 0.2
pH, UA: 7

## 2013-04-03 LAB — BASIC METABOLIC PANEL
BUN: 12 mg/dL (ref 6–23)
CALCIUM: 9 mg/dL (ref 8.4–10.5)
CO2: 29 meq/L (ref 19–32)
CREATININE: 0.8 mg/dL (ref 0.4–1.2)
Chloride: 101 mEq/L (ref 96–112)
GFR: 99.9 mL/min (ref >60.00)
GLUCOSE: 89 mg/dL (ref 70–99)
Potassium: 2.9 mEq/L — ABNORMAL LOW (ref 3.5–5.1)
Sodium: 138 mEq/L (ref 135–145)

## 2013-04-03 LAB — CBC WITH DIFFERENTIAL/PLATELET
BASOS PCT: 0.6 % (ref 0.0–3.0)
Basophils Absolute: 0.1 10*3/uL (ref 0.0–0.1)
EOS PCT: 4.2 % (ref 0.0–5.0)
Eosinophils Absolute: 0.4 10*3/uL (ref 0.0–0.7)
HCT: 39.4 % (ref 36.0–46.0)
Hemoglobin: 13.1 g/dL (ref 12.0–15.0)
LYMPHS PCT: 31.3 % (ref 12.0–46.0)
Lymphs Abs: 2.7 10*3/uL (ref 0.7–4.0)
MCHC: 33.2 g/dL (ref 30.0–36.0)
MCV: 83 fl (ref 78.0–100.0)
Monocytes Absolute: 0.8 10*3/uL (ref 0.1–1.0)
Monocytes Relative: 9 % (ref 3.0–12.0)
NEUTROS PCT: 54.9 % (ref 43.0–77.0)
Neutro Abs: 4.7 10*3/uL (ref 1.4–7.7)
Platelets: 274 10*3/uL (ref 150.0–400.0)
RBC: 4.74 Mil/uL (ref 3.87–5.11)
RDW: 14.8 % — ABNORMAL HIGH (ref 11.5–14.6)
WBC: 8.5 10*3/uL (ref 4.5–10.5)

## 2013-04-03 LAB — LIPID PANEL
Cholesterol: 183 mg/dL (ref 0–200)
HDL: 63.1 mg/dL (ref >39.00)
LDL CALC: 106 mg/dL — AB (ref 0–99)
Total CHOL/HDL Ratio: 3
Triglycerides: 68 mg/dL (ref 0.0–149.0)
VLDL: 13.6 mg/dL (ref 0.0–40.0)

## 2013-04-03 LAB — HEPATIC FUNCTION PANEL
ALT: 20 U/L (ref 0–35)
AST: 26 U/L (ref 0–37)
Albumin: 4.3 g/dL (ref 3.5–5.2)
Alkaline Phosphatase: 50 U/L (ref 39–117)
BILIRUBIN DIRECT: 0.1 mg/dL (ref 0.0–0.3)
TOTAL PROTEIN: 8 g/dL (ref 6.0–8.3)
Total Bilirubin: 1.1 mg/dL (ref 0.3–1.2)

## 2013-04-03 LAB — TSH: TSH: 1.4 u[IU]/mL (ref 0.35–5.50)

## 2013-04-09 ENCOUNTER — Encounter: Payer: Federal, State, Local not specified - PPO | Admitting: Family Medicine

## 2013-04-11 ENCOUNTER — Encounter: Payer: Federal, State, Local not specified - PPO | Admitting: Family Medicine

## 2013-04-11 ENCOUNTER — Telehealth: Payer: Self-pay | Admitting: Family Medicine

## 2013-04-11 NOTE — Telephone Encounter (Signed)
Pt would like to know her CPX lab results.  She is r/s for 06/10/12 for CPX.

## 2013-04-12 NOTE — Telephone Encounter (Signed)
Attempted to call patient at number given but mailbox was full

## 2013-06-10 ENCOUNTER — Encounter: Payer: Federal, State, Local not specified - PPO | Admitting: Family Medicine

## 2013-06-11 ENCOUNTER — Encounter: Payer: Self-pay | Admitting: Family Medicine

## 2013-06-11 ENCOUNTER — Ambulatory Visit (INDEPENDENT_AMBULATORY_CARE_PROVIDER_SITE_OTHER): Payer: Federal, State, Local not specified - PPO | Admitting: Family Medicine

## 2013-06-11 VITALS — BP 110/80 | Temp 98.8°F | Ht 65.5 in | Wt 165.0 lb

## 2013-06-11 DIAGNOSIS — I10 Essential (primary) hypertension: Secondary | ICD-10-CM

## 2013-06-11 DIAGNOSIS — N959 Unspecified menopausal and perimenopausal disorder: Secondary | ICD-10-CM

## 2013-06-11 MED ORDER — CHLORTHALIDONE 25 MG PO TABS: 25.0000 mg | ORAL_TABLET | Freq: Every day | ORAL | Status: DC

## 2013-06-11 MED ORDER — TRIAMTERENE-HCTZ 37.5-25 MG PO CAPS: 1.0000 | ORAL_CAPSULE | Freq: Every day | ORAL | Status: DC

## 2013-06-11 NOTE — Patient Instructions (Addendum)
Stop the chlorthalidone......... start Dyazide 1 daily in the morning  Start an exercise program along with a diet  Return in one year sooner if any problems

## 2013-06-11 NOTE — Progress Notes (Signed)
Pre visit review using our clinic review tool, if applicable. No additional management support is needed unless otherwise documented below in the visit note. 

## 2013-06-11 NOTE — Progress Notes (Signed)
   Subjective:    Patient ID: Carla RusselAngela T Jacobson, female    DOB: 1965-12-01, 48 y.o.   MRN: [id number]  HPI Carla Jacobson is a 48 year old female married nonsmoker who comes in today for general physical examination  She takes chlorthalidone 25 mg daily for hypertension BP 110 her 80  She takes Mobic 15 mg daily when necessary for plantar fasciitis  She gets routine eye care, dental care, colonoscopy in 2014 was normal. Her mother had colon polyps. She does not do BSE monthly and she's overdue her mammogram. She sees Dr. Senaida LangeS/b for Pap smears. She is postmenopausal times one year with minimal symptoms   Review of Systems  Constitutional: Negative.   HENT: Negative.   Eyes: Negative.   Respiratory: Negative.   Cardiovascular: Negative.   Gastrointestinal: Negative.   Genitourinary: Negative.   Musculoskeletal: Negative.   Neurological: Negative.   Psychiatric/Behavioral: Negative.        Objective:   Physical Exam  Constitutional: She appears well-developed and well-nourished.  HENT:  Head: Normocephalic and atraumatic.  Right Ear: External ear normal.  Left Ear: External ear normal.  Nose: Nose normal.  Mouth/Throat: Oropharynx is clear and moist.  Eyes: EOM are normal. Pupils are equal, round, and reactive to light.  Neck: Normal range of motion. Neck supple. No thyromegaly present.  Cardiovascular: Normal rate, regular rhythm, normal heart sounds and intact distal pulses.  Exam reveals no gallop and no friction rub.   No murmur heard. Pulmonary/Chest: Effort normal and breath sounds normal.  Abdominal: Soft. Bowel sounds are normal. She exhibits no distension and no mass. There is no tenderness. There is no rebound.  Genitourinary:  Bilateral breast exam normal  Musculoskeletal: Normal range of motion.  Lymphadenopathy:    She has no cervical adenopathy.  Neurological: She is alert. She has normal reflexes. No cranial nerve deficit. She exhibits normal muscle tone. Coordination  normal.  Skin: Skin is warm and dry.  Psychiatric: She has a normal mood and affect. Her behavior is normal. Judgment and thought content normal.          Assessment & Plan:  Healthy female  Hypertension continue chlorthalidone 25 mg daily  Overweight begin diet and exercise program  Post menopausal followup by GYN

## 2013-06-12 ENCOUNTER — Telehealth: Payer: Self-pay | Admitting: Family Medicine

## 2013-07-11 ENCOUNTER — Other Ambulatory Visit: Payer: Self-pay

## 2013-07-11 DIAGNOSIS — Z1231 Encounter for screening mammogram for malignant neoplasm of breast: Secondary | ICD-10-CM

## 2013-07-24 ENCOUNTER — Ambulatory Visit
Admission: RE | Admit: 2013-07-24 | Discharge: 2013-07-24 | Disposition: A | Discharge status: Home / Self Care | Payer: Federal, State, Local not specified - PPO | Source: Ambulatory Visit

## 2013-07-24 DIAGNOSIS — Z1231 Encounter for screening mammogram for malignant neoplasm of breast: Secondary | ICD-10-CM

## 2013-08-28 ENCOUNTER — Telehealth: Payer: Self-pay | Admitting: Family Medicine

## 2013-08-28 NOTE — Telephone Encounter (Signed)
Noted  

## 2013-08-28 NOTE — Telephone Encounter (Signed)
Patient Information:  Caller Name: Carla Jacobson  Phone: [phone]  Patient: Carla Jacobson  Gender: Female  DOB: 02/02/1966  Age: 48 Years  PCP: Kelle Darting [facility])  Pregnant: No  Office Follow Up:  Does the office need to follow up with this patient?: No  Instructions For The Office: N/A   Symptoms  Reason For Call & Symptoms: Calling about Headaches in front part of head- especially in the mornings and she is having dizzy spells and muscle aches on and off during the day. She just started taking Triamterine HCTZ 06/11/13 to help with low K+, but sx having been going on for past year. She is wondering if her K+ has come up to a normal level. Skin has been itchy for past 6 months. BP= 130-135/80-90.  Reviewed Health History In EMR: Yes  Reviewed Medications In EMR: Yes  Reviewed Allergies In EMR: Yes  Reviewed Surgeries / Procedures: Yes  Date of Onset of Symptoms: Unknown  Treatments Tried: Motrin 200 mgs 1-2  PO prn or Alleve 1 PO prn  Treatments Tried Worked: Yes OB / GYN:  LMP: Unknown  Guideline(s) Used:  Headache  Disposition Per Guideline:   See Today or Tomorrow in Office  Reason For Disposition Reached:   Unexplained headache that is present > 24 hours  Advice Given:  Reassurance - Migraine Headache:  You have told me that this headache is similar to previous migraine headaches that you have had. If the pattern or severity of your headache changes, you will need to see your physician.  Migraine headaches are also called vascular headaches. A migraine can be anywhere from mild to severely painful. Sufferers often describe it as throbbing or pulsing. It is often just on one side. Associated symptoms include nausea and vomiting. Some individuals will have visual or other neurological warning symptoms (aura) that a migraine is coming.  Reassurance - Muscle Tension Headache:  You have told me that this headache is similar to your previously diagnosed muscle  tension headaches.  The majority of headaches are caused by muscle tension.  The discomfort is usually diffuse and may be described as a "tight band" around the head. It may radiate down into the neck and shoulders. The discomfort can be aggravated by emotional stress.  Pain Medicines:  For pain relief, you can take either acetaminophen, ibuprofen, or naproxen.  Acetaminophen (e.g., Tylenol):  Regular Strength Tylenol: Take 650 mg (two 325 mg pills) by mouth every 4-6 hours as needed. Each Regular Strength Tylenol pill has 325 mg of acetaminophen.  Rest:   Lie down in a dark, quiet place and try to relax. Close your eyes and imagine your entire body relaxing.  Stretching:   Stretch and massage any tight neck muscles.  Stretching:   Stretch and massage any tight neck muscles.  Call Back If:  Headache lasts longer than 24 hours  You become worse.  Patient Will Follow Care Advice:  YES  Appointment Scheduled:  08/29/2013 12:15:00 Appointment Scheduled Provider:  Kelle Darting [facility])

## 2013-08-29 ENCOUNTER — Other Ambulatory Visit: Payer: Self-pay | Admitting: Family Medicine

## 2013-08-29 ENCOUNTER — Ambulatory Visit: Payer: Self-pay | Admitting: Family Medicine

## 2013-08-29 DIAGNOSIS — R51 Headache: Secondary | ICD-10-CM

## 2013-09-06 ENCOUNTER — Ambulatory Visit: Payer: Federal, State, Local not specified - PPO | Admitting: Neurology

## 2013-09-13 ENCOUNTER — Ambulatory Visit: Payer: Federal, State, Local not specified - PPO | Admitting: Neurology

## 2013-09-24 ENCOUNTER — Ambulatory Visit: Payer: Federal, State, Local not specified - PPO | Admitting: Neurology

## 2013-09-30 ENCOUNTER — Encounter: Payer: Self-pay | Admitting: Neurology

## 2013-09-30 ENCOUNTER — Ambulatory Visit (INDEPENDENT_AMBULATORY_CARE_PROVIDER_SITE_OTHER): Payer: Federal, State, Local not specified - PPO | Admitting: Neurology

## 2013-09-30 VITALS — BP 110/70 | HR 69 | Ht 66.0 in | Wt 162.9 lb

## 2013-09-30 DIAGNOSIS — R413 Other amnesia: Secondary | ICD-10-CM

## 2013-09-30 DIAGNOSIS — R519 Headache, unspecified: Secondary | ICD-10-CM | POA: Insufficient documentation

## 2013-09-30 DIAGNOSIS — R51 Headache: Secondary | ICD-10-CM

## 2013-09-30 DIAGNOSIS — Z823 Family history of stroke: Secondary | ICD-10-CM

## 2013-09-30 DIAGNOSIS — Z8249 Family history of ischemic heart disease and other diseases of the circulatory system: Secondary | ICD-10-CM

## 2013-09-30 NOTE — Progress Notes (Signed)
NEUROLOGY CONSULTATION NOTE  Carla Jacobson MRN: [id number] DOB: [birth year]  Referring provider: Dr. Kelle Darting Primary care provider: Dr. Kelle Darting  Reason for consult:  Headaches, memory loss  Dear Dr Tawanna Cooler:  Thank you for your kind referral of Carla Jacobson for consultation of the above symptoms. Although her history is well known to you, please allow me to reiterate it for the purpose of our medical record. Records and images were personally reviewed where available.  HISTORY OF PRESENT ILLNESS: This is a very pleasant 48 year old right-handed woman with a history of hypertension, in her usually state of health until a year ago when she started having days where she would wake up with a 7 or 8/10 frontal intense ache lasting up to 3 hours. Tylenol or Motrin helps.  Headaches occur 2-3 times a month, no associated nausea, vomiting, photo/phonophobia, focal numbness/tingling/weakness.  No vision changes or aura.  Despite 8/10 pain, she will "just deal with it" and continue on her day.  She denies any clear triggers except for sweets/candy. She usually gets 6-7 hours of sleep but feels tired on awakening. She has been told she snores, denies any apnea.  She denies any prior history of headaches. No family history of migraines.  There is a history of cerebral aneurysm in her father, paternal aunt, and maternal first cousin.  She has occasional pain going down both arms and cramps in both legs, mostly at night or when sitting for a prolonged time.  She denies any dysarthria/dysphagia, neck/back pain, bowel/bladder dysfunction.  She usually drinks 1 cup of coffee/day.  She reports some stress with her new job started last April but states this is less than her prior job.  She also has some stress taking care of her father during weekends.  She was having menopausal symptoms of hot flashes.  Her last menstrual period was a year ago.  She is concerned about memory changes more noticeable in  the last 6 months, where she cannot recall what she went to get in a room, as well as decreased ability to focus at work.  She does not get lost driving and has not missed bill payments.    Laboratory Data: Lab Results  Component Value Date   WBC 8.5 04/03/2013   HGB 13.1 04/03/2013   HCT 39.4 04/03/2013   MCV 83.0 04/03/2013   PLT 274.0 04/03/2013     Chemistry      Component Value Date/Time   NA 138 04/03/2013 1014   K 2.9* 04/03/2013 1014   CL 101 04/03/2013 1014   CO2 29 04/03/2013 1014   BUN 12 04/03/2013 1014   CREATININE 0.8 04/03/2013 1014      Component Value Date/Time   CALCIUM 9.0 04/03/2013 1014   ALKPHOS 50 04/03/2013 1014   AST 26 04/03/2013 1014   ALT 20 04/03/2013 1014   BILITOT 1.1 04/03/2013 1014     Lab Results  Component Value Date   TSH 1.40 04/03/2013    PAST MEDICAL HISTORY: Past Medical History  Diagnosis Date  . Environmental allergies   . Fasciitis   . Headache(784.0)   . Hypertension     PAST SURGICAL HISTORY: Past Surgical History  Procedure Laterality Date  . Belpharoptosis repair      r/eye  . Knee surgery      r/knee    MEDICATIONS: Current Outpatient Prescriptions on File Prior to Visit  Medication Sig Dispense Refill  . triamterene-hydrochlorothiazide (DYAZIDE) 37.5-25 MG per  capsule Take 1 each (1 capsule total) by mouth daily.  100 capsule  3  . ibuprofen (ADVIL,MOTRIN) 600 MG tablet Take 600 mg by mouth 2 (two) times daily as needed. pain       No current facility-administered medications on file prior to visit.    ALLERGIES: No Known Allergies  FAMILY HISTORY: Family History  Problem Relation Age of Onset  . Diabetes Mother   . Colon polyps Mother   . Hypertension Father     SOCIAL HISTORY: History   Social History  . Marital Status: Single    Spouse Name: N/A    Number of Children: N/A  . Years of Education: N/A   Occupational History  . Not on file.   Social History Main Topics  . Smoking status: Never Smoker   . Smokeless  tobacco: Never Used  . Alcohol Use: Yes     Comment: occasional   . Drug Use: No  . Sexual Activity: Yes    Birth Control/ Protection: Condom   Other Topics Concern  . Not on file   Social History Narrative  . No narrative on file    REVIEW OF SYSTEMS: Constitutional: No fevers, chills, or sweats, no generalized fatigue, change in appetite Eyes: No visual changes, double vision, eye pain Ear, nose and throat: No hearing loss, ear pain, nasal congestion, sore throat Cardiovascular: No chest pain, palpitations Respiratory:  No shortness of breath at rest or with exertion, wheezes GastrointestinaI: No nausea, vomiting, diarrhea, abdominal pain, fecal incontinence Genitourinary:  No dysuria, urinary retention or frequency Musculoskeletal:  No neck pain, back pain Integumentary: No rash, pruritus, skin lesions Neurological: as above Psychiatric: No depression, insomnia, anxiety Endocrine: No palpitations, fatigue, diaphoresis, mood swings, change in appetite, change in weight, increased thirst Hematologic/Lymphatic:  No anemia, purpura, petechiae. Allergic/Immunologic: no itchy/runny eyes, nasal congestion, recent allergic reactions, rashes  PHYSICAL EXAM: Filed Vitals:   09/30/13 1459  BP: 110/70  Pulse: 69   General: No acute distress Head:  Normocephalic/atraumatic Eyes: Fundoscopic exam shows bilateral sharp discs, no vessel changes, exudates, or hemorrhages Neck: supple, no paraspinal tenderness, full range of motion Back: No paraspinal tenderness Heart: regular rate and rhythm Lungs: Clear to auscultation bilaterally. Vascular: No carotid bruits. Skin/Extremities: No rash, no edema Neurological Exam: Mental status: alert and oriented to person, place, and time, no dysarthria or aphasia, Fund of knowledge is appropriate.  Recent and remote memory are intact.  Attention and concentration are normal.    Able to name objects and repeat phrases. Cranial nerves: CN I: not  tested CN II: pupils equal, round and reactive to light, visual fields intact, fundi unremarkable. CN III, IV, VI:  full range of motion, no nystagmus, no ptosis CN V: facial sensation intact CN VII: upper and lower face symmetric CN VIII: hearing intact to finger rub CN IX, X: gag intact, uvula midline CN XI: sternocleidomastoid and trapezius muscles intact CN XII: tongue midline Bulk & Tone: normal, no fasciculations. Motor: 5/5 throughout with no pronator drift. Sensation: intact to light touch, cold, pin, vibration and joint position sense.  No extinction to double simultaneous stimulation.  Romberg test negative Deep Tendon Reflexes: +2 throughout, no ankle clonus Plantar responses: downgoing bilaterally Cerebellar: no incoordination on finger to nose, heel to shin. No dysdiadochokinesia Gait: narrow-based and steady, able to tandem walk adequately. Tremor: none  IMPRESSION: This is a pleasant 48 year old right-handed woman with a history of hypertension and Grout family history of cerebral aneurysms, presenting  for new onset frontal headaches and memory changes.  Her neurological exam is normal.  Due to history of cerebral aneurysms, MRI and MRA brain will be ordered to assess for underlying structural abnormality.  We discussed symptomatic treatment of headaches and indications for headache prophylaxis. At this time we have agreed to hold off on prophylactic medications and use rescue medications such as Aleve.  She knows to minimize use of over the counter medications to 2-3/week to avoid rebound headaches. We discussed effects of stress and mood on headaches and memory, and she did endorse stress and improvement in symptoms when more relaxed.  She will keep a headache diary and follow-up in 3 months.  Thank you for allowing me to participate in the care of this patient. Please do not hesitate to call for any questions or concerns.   Patrcia DollyKaren Aleese Kamps, M.D.  CC: Dr. Tawanna Coolerodd

## 2013-09-30 NOTE — Patient Instructions (Signed)
1. MRI brain without contrast 2. MRA head without contrast 3. Keep a diary of headaches 4. Minimize over the counter medications to 2-3 a week to prevent rebound headaches

## 2013-10-14 ENCOUNTER — Ambulatory Visit (HOSPITAL_COMMUNITY)
Admission: RE | Admit: 2013-10-14 | Discharge: 2013-10-14 | Disposition: A | Discharge status: Home / Self Care | Payer: Federal, State, Local not specified - PPO | Source: Ambulatory Visit | Attending: Neurology | Admitting: Neurology

## 2013-10-14 DIAGNOSIS — H0520 Unspecified exophthalmos: Secondary | ICD-10-CM | POA: Insufficient documentation

## 2013-10-14 DIAGNOSIS — I10 Essential (primary) hypertension: Secondary | ICD-10-CM | POA: Insufficient documentation

## 2013-10-14 DIAGNOSIS — R930 Abnormal findings on diagnostic imaging of skull and head, not elsewhere classified: Secondary | ICD-10-CM | POA: Insufficient documentation

## 2013-10-14 DIAGNOSIS — R413 Other amnesia: Secondary | ICD-10-CM | POA: Insufficient documentation

## 2013-10-14 DIAGNOSIS — R51 Headache: Secondary | ICD-10-CM

## 2013-10-14 DIAGNOSIS — J3489 Other specified disorders of nose and nasal sinuses: Secondary | ICD-10-CM | POA: Insufficient documentation

## 2013-10-14 DIAGNOSIS — Z8249 Family history of ischemic heart disease and other diseases of the circulatory system: Secondary | ICD-10-CM

## 2013-10-28 ENCOUNTER — Telehealth: Payer: Self-pay | Admitting: Neurology

## 2013-10-28 NOTE — Telephone Encounter (Signed)
Please inform patient of Dr. Karel Jarvisquino is out of the office this week and will return next week for specific questions.    Donika K. Allena KatzPatel, DO

## 2013-10-28 NOTE — Telephone Encounter (Signed)
Pt called requesting to speak to Dr. Karel JarvisAquino regarding her recent MRI and MRA. C/B [phone]

## 2013-11-04 NOTE — Telephone Encounter (Signed)
Discussed MRI/MRA results. She has kept a headache diary, and has noticed that her head position on the pillow/pressure on headboard on bed. Wondering if pressure is causing it. Discussed avoidance of possible trigger, and if still having HAs, option for preventative. She would like to hold off on medication for now and keep HA diary, to discuss in Oct f/u. Discussed control of vascular risk factors for white matter changes seen on MRI. Patient expressed understanding and knows to call for any problems.

## 2013-11-04 NOTE — Telephone Encounter (Signed)
Please review

## 2013-11-13 ENCOUNTER — Telehealth: Payer: Self-pay | Admitting: Family Medicine
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
## 2013-11-13 NOTE — Telephone Encounter (Signed)
Okay but not before or after a physical. thanks

## 2013-11-13 NOTE — Telephone Encounter (Signed)
appt scheduled

## 2013-11-18 ENCOUNTER — Ambulatory Visit: Payer: Federal, State, Local not specified - PPO | Admitting: Family Medicine

## 2013-11-21 ENCOUNTER — Ambulatory Visit: Payer: Federal, State, Local not specified - PPO | Admitting: Family Medicine

## 2013-11-25 ENCOUNTER — Ambulatory Visit (INDEPENDENT_AMBULATORY_CARE_PROVIDER_SITE_OTHER): Payer: Federal, State, Local not specified - PPO | Admitting: Family Medicine

## 2013-11-25 ENCOUNTER — Encounter: Payer: Self-pay | Admitting: Family Medicine

## 2013-11-25 VITALS — BP 124/80 | Temp 98.3°F | Wt 161.0 lb

## 2013-11-25 DIAGNOSIS — M2550 Pain in unspecified joint: Secondary | ICD-10-CM

## 2013-11-25 DIAGNOSIS — Z041 Encounter for examination and observation following transport accident: Secondary | ICD-10-CM

## 2013-11-25 DIAGNOSIS — M2559 Pain in other specified joint: Secondary | ICD-10-CM

## 2013-11-25 MED ORDER — CYCLOBENZAPRINE HCL 5 MG PO TABS: ORAL_TABLET | ORAL | Status: DC

## 2013-11-25 NOTE — Progress Notes (Signed)
Pre visit review using our clinic review tool, if applicable. No additional management support is needed unless otherwise documented below in the visit note. 
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
## 2013-11-25 NOTE — Patient Instructions (Addendum)
Motrin 400 mg twice daily with food  Flexeril 5 mg................Marland Kitchen 1 at bedtime when necessary.  Normal activities and work

## 2014-02-06 ENCOUNTER — Ambulatory Visit: Payer: Federal, State, Local not specified - PPO | Admitting: *Deleted

## 2014-02-06 ENCOUNTER — Ambulatory Visit (INDEPENDENT_AMBULATORY_CARE_PROVIDER_SITE_OTHER): Payer: Federal, State, Local not specified - PPO | Admitting: *Deleted

## 2014-02-06 DIAGNOSIS — Z23 Encounter for immunization: Secondary | ICD-10-CM

## 2014-02-11 ENCOUNTER — Ambulatory Visit: Payer: Federal, State, Local not specified - PPO | Admitting: Family Medicine

## 2014-02-17 ENCOUNTER — Encounter: Payer: Self-pay | Admitting: Family Medicine

## 2014-02-17 ENCOUNTER — Ambulatory Visit (INDEPENDENT_AMBULATORY_CARE_PROVIDER_SITE_OTHER): Payer: Federal, State, Local not specified - PPO | Admitting: Family Medicine

## 2014-02-17 VITALS — BP 110/80 | Temp 98.1°F | Wt 164.0 lb

## 2014-02-17 DIAGNOSIS — Z041 Encounter for examination and observation following transport accident: Secondary | ICD-10-CM

## 2014-02-17 DIAGNOSIS — R109 Unspecified abdominal pain: Secondary | ICD-10-CM
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
## 2014-02-17 NOTE — Progress Notes (Signed)
Pre visit review using our clinic review tool, if applicable. No additional management support is needed unless otherwise documented below in the visit note. 

## 2014-02-17 NOTE — Patient Instructions (Signed)
We will set you up a consult for physical therapy for evaluation and treatment of your neck and shoulder pain  Return when necessary

## 2014-02-26 ENCOUNTER — Ambulatory Visit: Payer: Federal, State, Local not specified - PPO | Admitting: Physical Therapy

## 2014-02-27 ENCOUNTER — Ambulatory Visit: Payer: Federal, State, Local not specified - PPO | Attending: Family Medicine | Admitting: Physical Therapy

## 2014-02-27 DIAGNOSIS — R109 Unspecified abdominal pain: Secondary | ICD-10-CM | POA: Diagnosis not present

## 2014-03-03 ENCOUNTER — Ambulatory Visit: Payer: Federal, State, Local not specified - PPO | Admitting: Physical Therapy

## 2014-03-03 DIAGNOSIS — R109 Unspecified abdominal pain: Secondary | ICD-10-CM | POA: Diagnosis not present

## 2014-03-06 ENCOUNTER — Ambulatory Visit: Payer: Federal, State, Local not specified - PPO | Admitting: Physical Therapy

## 2014-03-06 DIAGNOSIS — R109 Unspecified abdominal pain: Secondary | ICD-10-CM | POA: Diagnosis not present

## 2014-03-10 ENCOUNTER — Ambulatory Visit: Payer: Federal, State, Local not specified - PPO | Admitting: Physical Therapy

## 2014-03-12 ENCOUNTER — Encounter: Payer: Federal, State, Local not specified - PPO | Admitting: Physical Therapy

## 2014-03-17 ENCOUNTER — Ambulatory Visit: Payer: Federal, State, Local not specified - PPO | Admitting: Physical Therapy

## 2014-03-17 DIAGNOSIS — R109 Unspecified abdominal pain: Secondary | ICD-10-CM | POA: Diagnosis not present

## 2014-03-19 ENCOUNTER — Ambulatory Visit: Payer: Federal, State, Local not specified - PPO | Admitting: Physical Therapy

## 2014-03-19 DIAGNOSIS — R109 Unspecified abdominal pain: Secondary | ICD-10-CM | POA: Diagnosis not present

## 2014-03-24 ENCOUNTER — Ambulatory Visit: Payer: Federal, State, Local not specified - PPO | Admitting: Physical Therapy

## 2014-03-26 ENCOUNTER — Ambulatory Visit: Payer: Federal, State, Local not specified - PPO | Admitting: Physical Therapy

## 2014-03-26 DIAGNOSIS — R109 Unspecified abdominal pain: Secondary | ICD-10-CM | POA: Diagnosis not present

## 2014-03-31 ENCOUNTER — Ambulatory Visit: Payer: Federal, State, Local not specified - PPO | Admitting: Physical Therapy

## 2014-04-02 ENCOUNTER — Encounter: Payer: Federal, State, Local not specified - PPO | Admitting: Physical Therapy

## 2014-07-25 ENCOUNTER — Other Ambulatory Visit: Payer: Self-pay | Admitting: Family Medicine

## 2014-10-30 ENCOUNTER — Other Ambulatory Visit: Payer: Self-pay | Admitting: *Deleted

## 2014-10-30 MED ORDER — TRIAMTERENE-HCTZ 37.5-25 MG PO CAPS: 1.0000 | ORAL_CAPSULE | Freq: Every day | ORAL | Status: DC

## 2014-10-30 NOTE — Telephone Encounter (Signed)
Rx done. 

## 2014-11-24 ENCOUNTER — Ambulatory Visit (INDEPENDENT_AMBULATORY_CARE_PROVIDER_SITE_OTHER): Payer: Federal, State, Local not specified - PPO | Admitting: Adult Health

## 2014-11-24 ENCOUNTER — Encounter: Payer: Self-pay | Admitting: Adult Health

## 2014-11-24 DIAGNOSIS — R5383 Other fatigue: Secondary | ICD-10-CM | POA: Diagnosis not present

## 2014-11-24 DIAGNOSIS — I10 Essential (primary) hypertension: Secondary | ICD-10-CM | POA: Diagnosis not present

## 2014-11-24 DIAGNOSIS — S90229A Contusion of unspecified lesser toe(s) with damage to nail, initial encounter: Secondary | ICD-10-CM | POA: Diagnosis not present

## 2014-11-24 LAB — CBC WITH DIFFERENTIAL/PLATELET
BASOS PCT: 0.8 % (ref 0.0–3.0)
Basophils Absolute: 0.1 10*3/uL (ref 0.0–0.1)
Eosinophils Absolute: 0.4 10*3/uL (ref 0.0–0.7)
Eosinophils Relative: 4.7 % (ref 0.0–5.0)
HEMATOCRIT: 40.2 % (ref 36.0–46.0)
HEMOGLOBIN: 13.3 g/dL (ref 12.0–15.0)
LYMPHS PCT: 35 % (ref 12.0–46.0)
Lymphs Abs: 2.9 10*3/uL (ref 0.7–4.0)
MCHC: 33 g/dL (ref 30.0–36.0)
MCV: 82.2 fl (ref 78.0–100.0)
MONOS PCT: 8.8 % (ref 3.0–12.0)
Monocytes Absolute: 0.7 10*3/uL (ref 0.1–1.0)
Neutro Abs: 4.2 10*3/uL (ref 1.4–7.7)
Neutrophils Relative %: 50.7 % (ref 43.0–77.0)
PLATELETS: 287 10*3/uL (ref 150.0–400.0)
RBC: 4.89 Mil/uL (ref 3.87–5.11)
RDW: 14.5 % (ref 11.5–15.5)
WBC: 8.3 10*3/uL (ref 4.0–10.5)

## 2014-11-24 LAB — TSH: TSH: 2.48 u[IU]/mL (ref 0.35–4.50)

## 2014-11-24 LAB — BASIC METABOLIC PANEL
BUN: 15 mg/dL (ref 6–23)
CHLORIDE: 103 meq/L (ref 96–112)
CO2: 31 mEq/L (ref 19–32)
Calcium: 9.2 mg/dL (ref 8.4–10.5)
Creatinine, Ser: 0.72 mg/dL (ref 0.40–1.20)
GFR: 110.43 mL/min (ref >60.00)
Glucose, Bld: 80 mg/dL (ref 70–99)
Potassium: 3.6 mEq/L (ref 3.5–5.1)
SODIUM: 138 meq/L (ref 135–145)

## 2014-11-24 LAB — VITAMIN B12: Vitamin B-12: 338 pg/mL (ref 211–911)

## 2014-11-24 MED ORDER — TRIAMTERENE-HCTZ 37.5-25 MG PO CAPS: 1.0000 | ORAL_CAPSULE | Freq: Every day | ORAL | Status: DC
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
## 2014-11-24 NOTE — Patient Instructions (Signed)
It was great meeting you today!  I will follow up with you regarding your labs.   I have sent in a prescription to the pharmacy for your blood pressure medication.

## 2015-01-01 ENCOUNTER — Other Ambulatory Visit: Payer: Self-pay

## 2015-01-01 DIAGNOSIS — Z1231 Encounter for screening mammogram for malignant neoplasm of breast: Secondary | ICD-10-CM

## 2015-01-08 ENCOUNTER — Ambulatory Visit: Payer: Federal, State, Local not specified - PPO

## 2015-01-15 ENCOUNTER — Ambulatory Visit: Payer: Federal, State, Local not specified - PPO

## 2015-02-12 ENCOUNTER — Ambulatory Visit
Admission: RE | Admit: 2015-02-12 | Discharge: 2015-02-12 | Disposition: A | Discharge status: Home / Self Care | Payer: Federal, State, Local not specified - PPO | Source: Ambulatory Visit

## 2015-02-12 DIAGNOSIS — Z1231 Encounter for screening mammogram for malignant neoplasm of breast: Secondary | ICD-10-CM

## 2015-07-30 DIAGNOSIS — K080 Exfoliation of teeth due to systemic causes: Secondary | ICD-10-CM | POA: Diagnosis not present

## 2015-09-17 DIAGNOSIS — E2839 Other primary ovarian failure: Secondary | ICD-10-CM | POA: Diagnosis not present

## 2015-09-17 DIAGNOSIS — R7309 Other abnormal glucose: Secondary | ICD-10-CM | POA: Diagnosis not present

## 2015-09-17 DIAGNOSIS — Z6825 Body mass index (BMI) 25.0-25.9, adult: Secondary | ICD-10-CM | POA: Diagnosis not present

## 2015-09-17 DIAGNOSIS — I10 Essential (primary) hypertension: Secondary | ICD-10-CM | POA: Diagnosis not present

## 2015-09-17 DIAGNOSIS — Z833 Family history of diabetes mellitus: Secondary | ICD-10-CM | POA: Diagnosis not present

## 2015-12-23 DIAGNOSIS — I10 Essential (primary) hypertension: Secondary | ICD-10-CM | POA: Diagnosis not present

## 2015-12-23 DIAGNOSIS — E2839 Other primary ovarian failure: Secondary | ICD-10-CM | POA: Diagnosis not present

## 2015-12-23 DIAGNOSIS — R7309 Other abnormal glucose: Secondary | ICD-10-CM | POA: Diagnosis not present

## 2015-12-29 DIAGNOSIS — I10 Essential (primary) hypertension: Secondary | ICD-10-CM | POA: Diagnosis not present

## 2015-12-29 DIAGNOSIS — Z833 Family history of diabetes mellitus: Secondary | ICD-10-CM | POA: Diagnosis not present

## 2015-12-29 DIAGNOSIS — Z0000 Encounter for general adult medical examination without abnormal findings: Secondary | ICD-10-CM | POA: Diagnosis not present

## 2015-12-29 DIAGNOSIS — E2839 Other primary ovarian failure: Secondary | ICD-10-CM | POA: Diagnosis not present

## 2016-01-01 DIAGNOSIS — M79602 Pain in left arm: Secondary | ICD-10-CM | POA: Diagnosis not present

## 2016-01-01 DIAGNOSIS — M25512 Pain in left shoulder: Secondary | ICD-10-CM | POA: Diagnosis not present

## 2016-01-27 DIAGNOSIS — M79602 Pain in left arm: Secondary | ICD-10-CM | POA: Diagnosis not present

## 2016-01-27 DIAGNOSIS — M25512 Pain in left shoulder: Secondary | ICD-10-CM | POA: Diagnosis not present

## 2016-02-02 DIAGNOSIS — M79602 Pain in left arm: Secondary | ICD-10-CM | POA: Diagnosis not present

## 2016-02-02 DIAGNOSIS — M25512 Pain in left shoulder: Secondary | ICD-10-CM | POA: Diagnosis not present

## 2016-02-03 DIAGNOSIS — K080 Exfoliation of teeth due to systemic causes: Secondary | ICD-10-CM | POA: Diagnosis not present

## 2016-02-04 ENCOUNTER — Other Ambulatory Visit: Payer: Self-pay | Admitting: Family Medicine

## 2016-02-04 DIAGNOSIS — Z1231 Encounter for screening mammogram for malignant neoplasm of breast: Secondary | ICD-10-CM

## 2016-02-05 DIAGNOSIS — M79602 Pain in left arm: Secondary | ICD-10-CM | POA: Diagnosis not present

## 2016-02-05 DIAGNOSIS — M25512 Pain in left shoulder: Secondary | ICD-10-CM | POA: Diagnosis not present

## 2016-02-09 DIAGNOSIS — M25512 Pain in left shoulder: Secondary | ICD-10-CM | POA: Diagnosis not present

## 2016-02-09 DIAGNOSIS — M79602 Pain in left arm: Secondary | ICD-10-CM | POA: Diagnosis not present

## 2016-02-15 DIAGNOSIS — M25512 Pain in left shoulder: Secondary | ICD-10-CM | POA: Diagnosis not present

## 2016-02-15 DIAGNOSIS — M79602 Pain in left arm: Secondary | ICD-10-CM | POA: Diagnosis not present

## 2016-03-03 DIAGNOSIS — M25512 Pain in left shoulder: Secondary | ICD-10-CM | POA: Diagnosis not present

## 2016-03-03 DIAGNOSIS — M79602 Pain in left arm: Secondary | ICD-10-CM | POA: Diagnosis not present

## 2016-03-09 ENCOUNTER — Ambulatory Visit
Admission: RE | Admit: 2016-03-09 | Discharge: 2016-03-09 | Disposition: A | Discharge status: Home / Self Care | Payer: Federal, State, Local not specified - PPO | Source: Ambulatory Visit | Attending: Family Medicine | Admitting: Family Medicine

## 2016-03-09 ENCOUNTER — Encounter: Payer: Self-pay | Admitting: Radiology

## 2016-03-09 DIAGNOSIS — Z1231 Encounter for screening mammogram for malignant neoplasm of breast: Secondary | ICD-10-CM

## 2016-03-10 ENCOUNTER — Other Ambulatory Visit: Payer: Self-pay | Admitting: Adult Health

## 2016-03-10 NOTE — Telephone Encounter (Signed)
Ok to refill 

## 2016-03-10 NOTE — Telephone Encounter (Signed)
She can have 30 days. Needs to follow up with her provider for a physical for further refills.

## 2016-04-20 DIAGNOSIS — M79602 Pain in left arm: Secondary | ICD-10-CM | POA: Diagnosis not present

## 2016-04-20 DIAGNOSIS — M25512 Pain in left shoulder: Secondary | ICD-10-CM | POA: Diagnosis not present

## 2016-04-28 DIAGNOSIS — M79602 Pain in left arm: Secondary | ICD-10-CM | POA: Diagnosis not present

## 2016-04-28 DIAGNOSIS — M25512 Pain in left shoulder: Secondary | ICD-10-CM | POA: Diagnosis not present

## 2016-08-04 DIAGNOSIS — K080 Exfoliation of teeth due to systemic causes: Secondary | ICD-10-CM | POA: Diagnosis not present

## 2016-08-18 DIAGNOSIS — I10 Essential (primary) hypertension: Secondary | ICD-10-CM | POA: Diagnosis not present

## 2016-08-18 DIAGNOSIS — R7309 Other abnormal glucose: Secondary | ICD-10-CM | POA: Diagnosis not present

## 2016-08-19 DIAGNOSIS — R7309 Other abnormal glucose: Secondary | ICD-10-CM | POA: Diagnosis not present

## 2016-08-19 DIAGNOSIS — I10 Essential (primary) hypertension: Secondary | ICD-10-CM | POA: Diagnosis not present

## 2016-12-16 ENCOUNTER — Encounter: Payer: Self-pay | Admitting: Family Medicine

## 2017-02-14 DIAGNOSIS — K080 Exfoliation of teeth due to systemic causes: Secondary | ICD-10-CM | POA: Diagnosis not present

## 2017-02-28 ENCOUNTER — Other Ambulatory Visit: Payer: Self-pay | Admitting: Family Medicine

## 2017-02-28 DIAGNOSIS — Z1231 Encounter for screening mammogram for malignant neoplasm of breast: Secondary | ICD-10-CM

## 2017-03-09 DIAGNOSIS — Z833 Family history of diabetes mellitus: Secondary | ICD-10-CM | POA: Diagnosis not present

## 2017-03-09 DIAGNOSIS — R7309 Other abnormal glucose: Secondary | ICD-10-CM | POA: Diagnosis not present

## 2017-03-09 DIAGNOSIS — I10 Essential (primary) hypertension: Secondary | ICD-10-CM | POA: Diagnosis not present

## 2017-03-11 DIAGNOSIS — I10 Essential (primary) hypertension: Secondary | ICD-10-CM | POA: Diagnosis not present

## 2017-03-11 DIAGNOSIS — R7309 Other abnormal glucose: Secondary | ICD-10-CM | POA: Diagnosis not present

## 2017-03-11 DIAGNOSIS — M79642 Pain in left hand: Secondary | ICD-10-CM | POA: Diagnosis not present

## 2017-03-29 ENCOUNTER — Ambulatory Visit
Admission: RE | Admit: 2017-03-29 | Discharge: 2017-03-29 | Disposition: A | Discharge status: Home / Self Care | Payer: Federal, State, Local not specified - PPO | Source: Ambulatory Visit | Attending: Family Medicine | Admitting: Family Medicine

## 2017-03-29 DIAGNOSIS — Z1231 Encounter for screening mammogram for malignant neoplasm of breast: Secondary | ICD-10-CM | POA: Diagnosis not present

## 2017-03-30 DIAGNOSIS — R200 Anesthesia of skin: Secondary | ICD-10-CM | POA: Diagnosis not present

## 2017-04-05 DIAGNOSIS — M542 Cervicalgia: Secondary | ICD-10-CM | POA: Diagnosis not present

## 2017-04-11 DIAGNOSIS — M5412 Radiculopathy, cervical region: Secondary | ICD-10-CM | POA: Diagnosis not present

## 2017-04-11 DIAGNOSIS — R200 Anesthesia of skin: Secondary | ICD-10-CM | POA: Diagnosis not present

## 2017-05-23 DIAGNOSIS — Z124 Encounter for screening for malignant neoplasm of cervix: Secondary | ICD-10-CM | POA: Diagnosis not present

## 2017-05-23 DIAGNOSIS — Z113 Encounter for screening for infections with a predominantly sexual mode of transmission: Secondary | ICD-10-CM | POA: Diagnosis not present

## 2017-05-23 DIAGNOSIS — Z6827 Body mass index (BMI) 27.0-27.9, adult: Secondary | ICD-10-CM | POA: Diagnosis not present

## 2017-05-23 DIAGNOSIS — J069 Acute upper respiratory infection, unspecified: Secondary | ICD-10-CM | POA: Diagnosis not present

## 2017-05-23 DIAGNOSIS — Z01419 Encounter for gynecological examination (general) (routine) without abnormal findings: Secondary | ICD-10-CM | POA: Diagnosis not present

## 2017-08-28 DIAGNOSIS — K080 Exfoliation of teeth due to systemic causes: Secondary | ICD-10-CM | POA: Diagnosis not present

## 2017-09-09 DIAGNOSIS — G629 Polyneuropathy, unspecified: Secondary | ICD-10-CM | POA: Diagnosis not present

## 2017-09-09 DIAGNOSIS — I10 Essential (primary) hypertension: Secondary | ICD-10-CM | POA: Diagnosis not present

## 2017-09-09 DIAGNOSIS — F4329 Adjustment disorder with other symptoms: Secondary | ICD-10-CM | POA: Diagnosis not present

## 2017-10-12 DIAGNOSIS — H0014 Chalazion left upper eyelid: Secondary | ICD-10-CM | POA: Diagnosis not present

## 2017-10-12 DIAGNOSIS — H40013 Open angle with borderline findings, low risk, bilateral: Secondary | ICD-10-CM | POA: Diagnosis not present

## 2017-10-12 DIAGNOSIS — H04123 Dry eye syndrome of bilateral lacrimal glands: Secondary | ICD-10-CM | POA: Diagnosis not present

## 2017-12-06 DIAGNOSIS — E2839 Other primary ovarian failure: Secondary | ICD-10-CM | POA: Diagnosis not present

## 2017-12-06 DIAGNOSIS — I10 Essential (primary) hypertension: Secondary | ICD-10-CM | POA: Diagnosis not present

## 2017-12-06 DIAGNOSIS — R7309 Other abnormal glucose: Secondary | ICD-10-CM | POA: Diagnosis not present

## 2017-12-09 DIAGNOSIS — R7309 Other abnormal glucose: Secondary | ICD-10-CM | POA: Diagnosis not present

## 2017-12-09 DIAGNOSIS — G629 Polyneuropathy, unspecified: Secondary | ICD-10-CM | POA: Diagnosis not present

## 2017-12-09 DIAGNOSIS — Z6828 Body mass index (BMI) 28.0-28.9, adult: Secondary | ICD-10-CM | POA: Diagnosis not present

## 2017-12-09 DIAGNOSIS — I10 Essential (primary) hypertension: Secondary | ICD-10-CM | POA: Diagnosis not present

## 2018-03-13 ENCOUNTER — Other Ambulatory Visit: Payer: Self-pay | Admitting: Family Medicine

## 2018-03-13 DIAGNOSIS — Z1231 Encounter for screening mammogram for malignant neoplasm of breast: Secondary | ICD-10-CM

## 2018-03-15 DIAGNOSIS — K080 Exfoliation of teeth due to systemic causes: Secondary | ICD-10-CM | POA: Diagnosis not present

## 2018-04-20 ENCOUNTER — Ambulatory Visit: Payer: Federal, State, Local not specified - PPO

## 2018-04-23 ENCOUNTER — Ambulatory Visit
Admission: RE | Admit: 2018-04-23 | Discharge: 2018-04-23 | Disposition: A | Discharge status: Home / Self Care | Payer: Federal, State, Local not specified - PPO | Source: Ambulatory Visit | Attending: Family Medicine | Admitting: Family Medicine

## 2018-04-23 DIAGNOSIS — Z1231 Encounter for screening mammogram for malignant neoplasm of breast: Secondary | ICD-10-CM | POA: Diagnosis not present

## 2018-09-07 DIAGNOSIS — Z6828 Body mass index (BMI) 28.0-28.9, adult: Secondary | ICD-10-CM | POA: Diagnosis not present

## 2018-09-07 DIAGNOSIS — R7309 Other abnormal glucose: Secondary | ICD-10-CM | POA: Diagnosis not present

## 2018-09-07 DIAGNOSIS — G629 Polyneuropathy, unspecified: Secondary | ICD-10-CM | POA: Diagnosis not present

## 2018-09-07 DIAGNOSIS — I10 Essential (primary) hypertension: Secondary | ICD-10-CM | POA: Diagnosis not present

## 2018-09-11 DIAGNOSIS — F331 Major depressive disorder, recurrent, moderate: Secondary | ICD-10-CM | POA: Diagnosis not present

## 2018-09-11 DIAGNOSIS — Z0000 Encounter for general adult medical examination without abnormal findings: Secondary | ICD-10-CM | POA: Diagnosis not present

## 2018-09-11 DIAGNOSIS — I10 Essential (primary) hypertension: Secondary | ICD-10-CM | POA: Diagnosis not present

## 2018-09-11 DIAGNOSIS — F4329 Adjustment disorder with other symptoms: Secondary | ICD-10-CM | POA: Diagnosis not present

## 2018-10-23 DIAGNOSIS — F4320 Adjustment disorder, unspecified: Secondary | ICD-10-CM | POA: Diagnosis not present

## 2018-10-23 DIAGNOSIS — I10 Essential (primary) hypertension: Secondary | ICD-10-CM | POA: Diagnosis not present

## 2018-10-23 DIAGNOSIS — F331 Major depressive disorder, recurrent, moderate: Secondary | ICD-10-CM | POA: Diagnosis not present

## 2018-10-25 ENCOUNTER — Other Ambulatory Visit: Payer: Self-pay | Admitting: Obstetrics and Gynecology

## 2018-10-25 DIAGNOSIS — Z01419 Encounter for gynecological examination (general) (routine) without abnormal findings: Secondary | ICD-10-CM | POA: Diagnosis not present

## 2018-10-25 DIAGNOSIS — N6320 Unspecified lump in the left breast, unspecified quadrant: Secondary | ICD-10-CM

## 2018-10-25 DIAGNOSIS — Z6827 Body mass index (BMI) 27.0-27.9, adult: Secondary | ICD-10-CM | POA: Diagnosis not present

## 2018-11-20 ENCOUNTER — Other Ambulatory Visit: Payer: Self-pay

## 2018-11-20 ENCOUNTER — Other Ambulatory Visit: Payer: Federal, State, Local not specified - PPO

## 2018-11-20 ENCOUNTER — Ambulatory Visit
Admission: RE | Admit: 2018-11-20 | Discharge: 2018-11-20 | Disposition: A | Discharge status: Home / Self Care | Payer: Federal, State, Local not specified - PPO | Source: Ambulatory Visit | Attending: Obstetrics and Gynecology | Admitting: Obstetrics and Gynecology

## 2018-11-20 DIAGNOSIS — N6320 Unspecified lump in the left breast, unspecified quadrant: Secondary | ICD-10-CM

## 2018-11-20 DIAGNOSIS — R928 Other abnormal and inconclusive findings on diagnostic imaging of breast: Secondary | ICD-10-CM | POA: Diagnosis not present

## 2018-11-20 DIAGNOSIS — N6489 Other specified disorders of breast: Secondary | ICD-10-CM | POA: Diagnosis not present

## 2019-01-07 DIAGNOSIS — Z23 Encounter for immunization: Secondary | ICD-10-CM | POA: Diagnosis not present

## 2019-02-28 DIAGNOSIS — R7309 Other abnormal glucose: Secondary | ICD-10-CM | POA: Diagnosis not present

## 2019-02-28 DIAGNOSIS — I10 Essential (primary) hypertension: Secondary | ICD-10-CM | POA: Diagnosis not present

## 2019-02-28 DIAGNOSIS — F4320 Adjustment disorder, unspecified: Secondary | ICD-10-CM | POA: Diagnosis not present

## 2019-02-28 DIAGNOSIS — E2839 Other primary ovarian failure: Secondary | ICD-10-CM | POA: Diagnosis not present

## 2019-05-02 DIAGNOSIS — M79641 Pain in right hand: Secondary | ICD-10-CM | POA: Diagnosis not present

## 2019-05-02 DIAGNOSIS — M79642 Pain in left hand: Secondary | ICD-10-CM | POA: Diagnosis not present

## 2019-05-15 DIAGNOSIS — M5412 Radiculopathy, cervical region: Secondary | ICD-10-CM | POA: Diagnosis not present

## 2019-05-15 DIAGNOSIS — R200 Anesthesia of skin: Secondary | ICD-10-CM | POA: Diagnosis not present

## 2019-05-20 DIAGNOSIS — H16143 Punctate keratitis, bilateral: Secondary | ICD-10-CM | POA: Diagnosis not present

## 2019-05-20 DIAGNOSIS — H16223 Keratoconjunctivitis sicca, not specified as Sjogren's, bilateral: Secondary | ICD-10-CM | POA: Diagnosis not present

## 2019-05-28 DIAGNOSIS — M5412 Radiculopathy, cervical region: Secondary | ICD-10-CM | POA: Diagnosis not present

## 2019-07-11 DIAGNOSIS — E782 Mixed hyperlipidemia: Secondary | ICD-10-CM | POA: Diagnosis not present

## 2019-07-11 DIAGNOSIS — R7309 Other abnormal glucose: Secondary | ICD-10-CM | POA: Diagnosis not present

## 2019-07-11 DIAGNOSIS — I10 Essential (primary) hypertension: Secondary | ICD-10-CM | POA: Diagnosis not present

## 2019-07-11 DIAGNOSIS — R799 Abnormal finding of blood chemistry, unspecified: Secondary | ICD-10-CM | POA: Diagnosis not present

## 2019-07-13 DIAGNOSIS — I10 Essential (primary) hypertension: Secondary | ICD-10-CM | POA: Diagnosis not present

## 2019-07-13 DIAGNOSIS — M130 Polyarthritis, unspecified: Secondary | ICD-10-CM | POA: Diagnosis not present

## 2019-07-18 ENCOUNTER — Other Ambulatory Visit: Payer: Self-pay | Admitting: Family Medicine

## 2019-07-18 ENCOUNTER — Ambulatory Visit
Admission: RE | Admit: 2019-07-18 | Discharge: 2019-07-18 | Disposition: A | Discharge status: Home / Self Care | Payer: Federal, State, Local not specified - PPO | Source: Ambulatory Visit | Attending: Family Medicine | Admitting: Family Medicine

## 2019-07-18 ENCOUNTER — Other Ambulatory Visit: Payer: Self-pay

## 2019-07-18 DIAGNOSIS — M19032 Primary osteoarthritis, left wrist: Secondary | ICD-10-CM | POA: Diagnosis not present

## 2019-07-18 DIAGNOSIS — M79641 Pain in right hand: Secondary | ICD-10-CM

## 2019-07-18 DIAGNOSIS — M19042 Primary osteoarthritis, left hand: Secondary | ICD-10-CM | POA: Diagnosis not present

## 2019-07-18 DIAGNOSIS — M19041 Primary osteoarthritis, right hand: Secondary | ICD-10-CM | POA: Diagnosis not present

## 2019-07-18 DIAGNOSIS — M069 Rheumatoid arthritis, unspecified: Secondary | ICD-10-CM | POA: Diagnosis not present

## 2019-07-18 DIAGNOSIS — M19031 Primary osteoarthritis, right wrist: Secondary | ICD-10-CM | POA: Diagnosis not present

## 2019-07-18 DIAGNOSIS — M79642 Pain in left hand: Secondary | ICD-10-CM

## 2019-07-25 ENCOUNTER — Other Ambulatory Visit: Payer: Self-pay | Admitting: Obstetrics and Gynecology

## 2019-07-25 DIAGNOSIS — Z1231 Encounter for screening mammogram for malignant neoplasm of breast: Secondary | ICD-10-CM

## 2019-07-30 DIAGNOSIS — M130 Polyarthritis, unspecified: Secondary | ICD-10-CM | POA: Diagnosis not present

## 2019-08-27 ENCOUNTER — Ambulatory Visit: Payer: Federal, State, Local not specified - PPO

## 2019-09-19 ENCOUNTER — Other Ambulatory Visit: Payer: Self-pay

## 2019-09-19 ENCOUNTER — Ambulatory Visit
Admission: RE | Admit: 2019-09-19 | Discharge: 2019-09-19 | Disposition: A | Discharge status: Home / Self Care | Payer: Federal, State, Local not specified - PPO | Source: Ambulatory Visit | Attending: Obstetrics and Gynecology | Admitting: Obstetrics and Gynecology

## 2019-09-19 DIAGNOSIS — Z1231 Encounter for screening mammogram for malignant neoplasm of breast: Secondary | ICD-10-CM | POA: Diagnosis not present

## 2019-10-17 DIAGNOSIS — H16223 Keratoconjunctivitis sicca, not specified as Sjogren's, bilateral: Secondary | ICD-10-CM | POA: Diagnosis not present

## 2019-10-17 DIAGNOSIS — H40013 Open angle with borderline findings, low risk, bilateral: Secondary | ICD-10-CM | POA: Diagnosis not present

## 2019-12-20 DIAGNOSIS — M130 Polyarthritis, unspecified: Secondary | ICD-10-CM | POA: Diagnosis not present

## 2019-12-20 DIAGNOSIS — I10 Essential (primary) hypertension: Secondary | ICD-10-CM | POA: Diagnosis not present

## 2019-12-21 DIAGNOSIS — M130 Polyarthritis, unspecified: Secondary | ICD-10-CM | POA: Diagnosis not present

## 2019-12-21 DIAGNOSIS — Z23 Encounter for immunization: Secondary | ICD-10-CM | POA: Diagnosis not present

## 2019-12-21 DIAGNOSIS — F331 Major depressive disorder, recurrent, moderate: Secondary | ICD-10-CM | POA: Diagnosis not present

## 2019-12-21 DIAGNOSIS — E782 Mixed hyperlipidemia: Secondary | ICD-10-CM | POA: Diagnosis not present

## 2019-12-21 DIAGNOSIS — I10 Essential (primary) hypertension: Secondary | ICD-10-CM | POA: Diagnosis not present

## 2019-12-30 DIAGNOSIS — L650 Telogen effluvium: Secondary | ICD-10-CM | POA: Diagnosis not present

## 2019-12-30 DIAGNOSIS — M1990 Unspecified osteoarthritis, unspecified site: Secondary | ICD-10-CM | POA: Insufficient documentation

## 2019-12-30 DIAGNOSIS — I10 Essential (primary) hypertension: Secondary | ICD-10-CM | POA: Insufficient documentation

## 2019-12-30 DIAGNOSIS — L648 Other androgenic alopecia: Secondary | ICD-10-CM | POA: Diagnosis not present

## 2019-12-30 DIAGNOSIS — T148XXA Other injury of unspecified body region, initial encounter: Secondary | ICD-10-CM | POA: Insufficient documentation

## 2019-12-30 DIAGNOSIS — Z713 Dietary counseling and surveillance: Secondary | ICD-10-CM | POA: Diagnosis not present

## 2020-02-13 DIAGNOSIS — Z01419 Encounter for gynecological examination (general) (routine) without abnormal findings: Secondary | ICD-10-CM | POA: Diagnosis not present

## 2020-02-13 DIAGNOSIS — N760 Acute vaginitis: Secondary | ICD-10-CM | POA: Diagnosis not present

## 2020-02-13 DIAGNOSIS — Z113 Encounter for screening for infections with a predominantly sexual mode of transmission: Secondary | ICD-10-CM | POA: Diagnosis not present

## 2020-05-04 DIAGNOSIS — L648 Other androgenic alopecia: Secondary | ICD-10-CM | POA: Diagnosis not present

## 2020-05-04 DIAGNOSIS — Z713 Dietary counseling and surveillance: Secondary | ICD-10-CM | POA: Diagnosis not present

## 2020-05-23 DIAGNOSIS — M130 Polyarthritis, unspecified: Secondary | ICD-10-CM | POA: Diagnosis not present

## 2020-05-23 DIAGNOSIS — I10 Essential (primary) hypertension: Secondary | ICD-10-CM | POA: Diagnosis not present

## 2020-05-23 DIAGNOSIS — E782 Mixed hyperlipidemia: Secondary | ICD-10-CM | POA: Diagnosis not present

## 2020-05-27 DIAGNOSIS — M130 Polyarthritis, unspecified: Secondary | ICD-10-CM | POA: Diagnosis not present

## 2020-05-27 DIAGNOSIS — I10 Essential (primary) hypertension: Secondary | ICD-10-CM | POA: Diagnosis not present

## 2020-05-27 DIAGNOSIS — R7309 Other abnormal glucose: Secondary | ICD-10-CM | POA: Diagnosis not present

## 2020-05-27 DIAGNOSIS — G629 Polyneuropathy, unspecified: Secondary | ICD-10-CM | POA: Diagnosis not present

## 2020-09-26 DIAGNOSIS — R7309 Other abnormal glucose: Secondary | ICD-10-CM | POA: Diagnosis not present

## 2020-09-26 DIAGNOSIS — E782 Mixed hyperlipidemia: Secondary | ICD-10-CM | POA: Diagnosis not present

## 2020-09-26 DIAGNOSIS — I10 Essential (primary) hypertension: Secondary | ICD-10-CM | POA: Diagnosis not present

## 2020-09-30 DIAGNOSIS — M130 Polyarthritis, unspecified: Secondary | ICD-10-CM | POA: Diagnosis not present

## 2020-09-30 DIAGNOSIS — I10 Essential (primary) hypertension: Secondary | ICD-10-CM | POA: Diagnosis not present

## 2020-10-06 ENCOUNTER — Other Ambulatory Visit: Payer: Self-pay | Admitting: Obstetrics and Gynecology

## 2020-10-06 DIAGNOSIS — Z1231 Encounter for screening mammogram for malignant neoplasm of breast: Secondary | ICD-10-CM

## 2020-10-20 DIAGNOSIS — H40013 Open angle with borderline findings, low risk, bilateral: Secondary | ICD-10-CM | POA: Diagnosis not present

## 2020-10-20 DIAGNOSIS — H16223 Keratoconjunctivitis sicca, not specified as Sjogren's, bilateral: Secondary | ICD-10-CM | POA: Diagnosis not present

## 2020-11-02 DIAGNOSIS — L821 Other seborrheic keratosis: Secondary | ICD-10-CM | POA: Diagnosis not present

## 2020-11-02 DIAGNOSIS — Z713 Dietary counseling and surveillance: Secondary | ICD-10-CM | POA: Diagnosis not present

## 2020-11-02 DIAGNOSIS — L648 Other androgenic alopecia: Secondary | ICD-10-CM | POA: Diagnosis not present

## 2020-12-03 ENCOUNTER — Other Ambulatory Visit: Payer: Self-pay

## 2020-12-03 ENCOUNTER — Ambulatory Visit
Admission: RE | Admit: 2020-12-03 | Discharge: 2020-12-03 | Disposition: A | Discharge status: Home / Self Care | Payer: Federal, State, Local not specified - PPO | Source: Ambulatory Visit | Attending: Obstetrics and Gynecology | Admitting: Obstetrics and Gynecology

## 2020-12-03 DIAGNOSIS — Z1231 Encounter for screening mammogram for malignant neoplasm of breast: Secondary | ICD-10-CM | POA: Diagnosis not present

## 2020-12-29 DIAGNOSIS — M25511 Pain in right shoulder: Secondary | ICD-10-CM | POA: Diagnosis not present

## 2020-12-29 DIAGNOSIS — M7061 Trochanteric bursitis, right hip: Secondary | ICD-10-CM | POA: Diagnosis not present

## 2021-02-04 DIAGNOSIS — E782 Mixed hyperlipidemia: Secondary | ICD-10-CM | POA: Diagnosis not present

## 2021-02-04 DIAGNOSIS — F331 Major depressive disorder, recurrent, moderate: Secondary | ICD-10-CM | POA: Diagnosis not present

## 2021-02-04 DIAGNOSIS — I10 Essential (primary) hypertension: Secondary | ICD-10-CM | POA: Diagnosis not present

## 2021-02-04 DIAGNOSIS — R7309 Other abnormal glucose: Secondary | ICD-10-CM | POA: Diagnosis not present

## 2021-02-05 DIAGNOSIS — F331 Major depressive disorder, recurrent, moderate: Secondary | ICD-10-CM | POA: Diagnosis not present

## 2021-02-05 DIAGNOSIS — I10 Essential (primary) hypertension: Secondary | ICD-10-CM | POA: Diagnosis not present

## 2021-02-05 DIAGNOSIS — F4329 Adjustment disorder with other symptoms: Secondary | ICD-10-CM | POA: Diagnosis not present

## 2021-02-05 DIAGNOSIS — Z833 Family history of diabetes mellitus: Secondary | ICD-10-CM | POA: Diagnosis not present

## 2021-02-25 DIAGNOSIS — M5442 Lumbago with sciatica, left side: Secondary | ICD-10-CM | POA: Diagnosis not present

## 2021-02-26 DIAGNOSIS — M6281 Muscle weakness (generalized): Secondary | ICD-10-CM | POA: Diagnosis not present

## 2021-02-26 DIAGNOSIS — M5416 Radiculopathy, lumbar region: Secondary | ICD-10-CM | POA: Diagnosis not present

## 2021-03-02 DIAGNOSIS — M5416 Radiculopathy, lumbar region: Secondary | ICD-10-CM | POA: Diagnosis not present

## 2021-03-02 DIAGNOSIS — M6281 Muscle weakness (generalized): Secondary | ICD-10-CM | POA: Diagnosis not present

## 2021-03-04 DIAGNOSIS — M5416 Radiculopathy, lumbar region: Secondary | ICD-10-CM | POA: Diagnosis not present

## 2021-03-04 DIAGNOSIS — M6281 Muscle weakness (generalized): Secondary | ICD-10-CM | POA: Diagnosis not present

## 2021-03-09 DIAGNOSIS — M6281 Muscle weakness (generalized): Secondary | ICD-10-CM | POA: Diagnosis not present

## 2021-03-09 DIAGNOSIS — M5416 Radiculopathy, lumbar region: Secondary | ICD-10-CM | POA: Diagnosis not present

## 2021-03-11 DIAGNOSIS — M5416 Radiculopathy, lumbar region: Secondary | ICD-10-CM | POA: Diagnosis not present

## 2021-03-11 DIAGNOSIS — M6281 Muscle weakness (generalized): Secondary | ICD-10-CM | POA: Diagnosis not present

## 2021-03-16 DIAGNOSIS — M5416 Radiculopathy, lumbar region: Secondary | ICD-10-CM | POA: Diagnosis not present

## 2021-03-16 DIAGNOSIS — M6281 Muscle weakness (generalized): Secondary | ICD-10-CM | POA: Diagnosis not present

## 2021-03-18 DIAGNOSIS — M6281 Muscle weakness (generalized): Secondary | ICD-10-CM | POA: Diagnosis not present

## 2021-03-18 DIAGNOSIS — M5416 Radiculopathy, lumbar region: Secondary | ICD-10-CM | POA: Diagnosis not present

## 2021-03-25 DIAGNOSIS — M5442 Lumbago with sciatica, left side: Secondary | ICD-10-CM | POA: Diagnosis not present

## 2021-03-25 DIAGNOSIS — M7061 Trochanteric bursitis, right hip: Secondary | ICD-10-CM | POA: Diagnosis not present

## 2021-03-25 DIAGNOSIS — M5416 Radiculopathy, lumbar region: Secondary | ICD-10-CM | POA: Diagnosis not present

## 2021-03-25 DIAGNOSIS — M6281 Muscle weakness (generalized): Secondary | ICD-10-CM | POA: Diagnosis not present

## 2021-03-26 DIAGNOSIS — R8290 Unspecified abnormal findings in urine: Secondary | ICD-10-CM | POA: Diagnosis not present

## 2021-03-26 DIAGNOSIS — N390 Urinary tract infection, site not specified: Secondary | ICD-10-CM | POA: Insufficient documentation

## 2021-03-26 DIAGNOSIS — Z6826 Body mass index (BMI) 26.0-26.9, adult: Secondary | ICD-10-CM | POA: Diagnosis not present

## 2021-03-26 DIAGNOSIS — Z01419 Encounter for gynecological examination (general) (routine) without abnormal findings: Secondary | ICD-10-CM | POA: Diagnosis not present

## 2021-08-04 IMAGING — CR DG HAND COMPLETE 3+V*L*
3 series · 3 of 3 positions shown · non-contrast
Comparison: No prior.

CLINICAL DATA: Chronic bilateral hand and wrist pain.

EXAM:
LEFT HAND - COMPLETE 3+ VIEW

[x hand pa left]
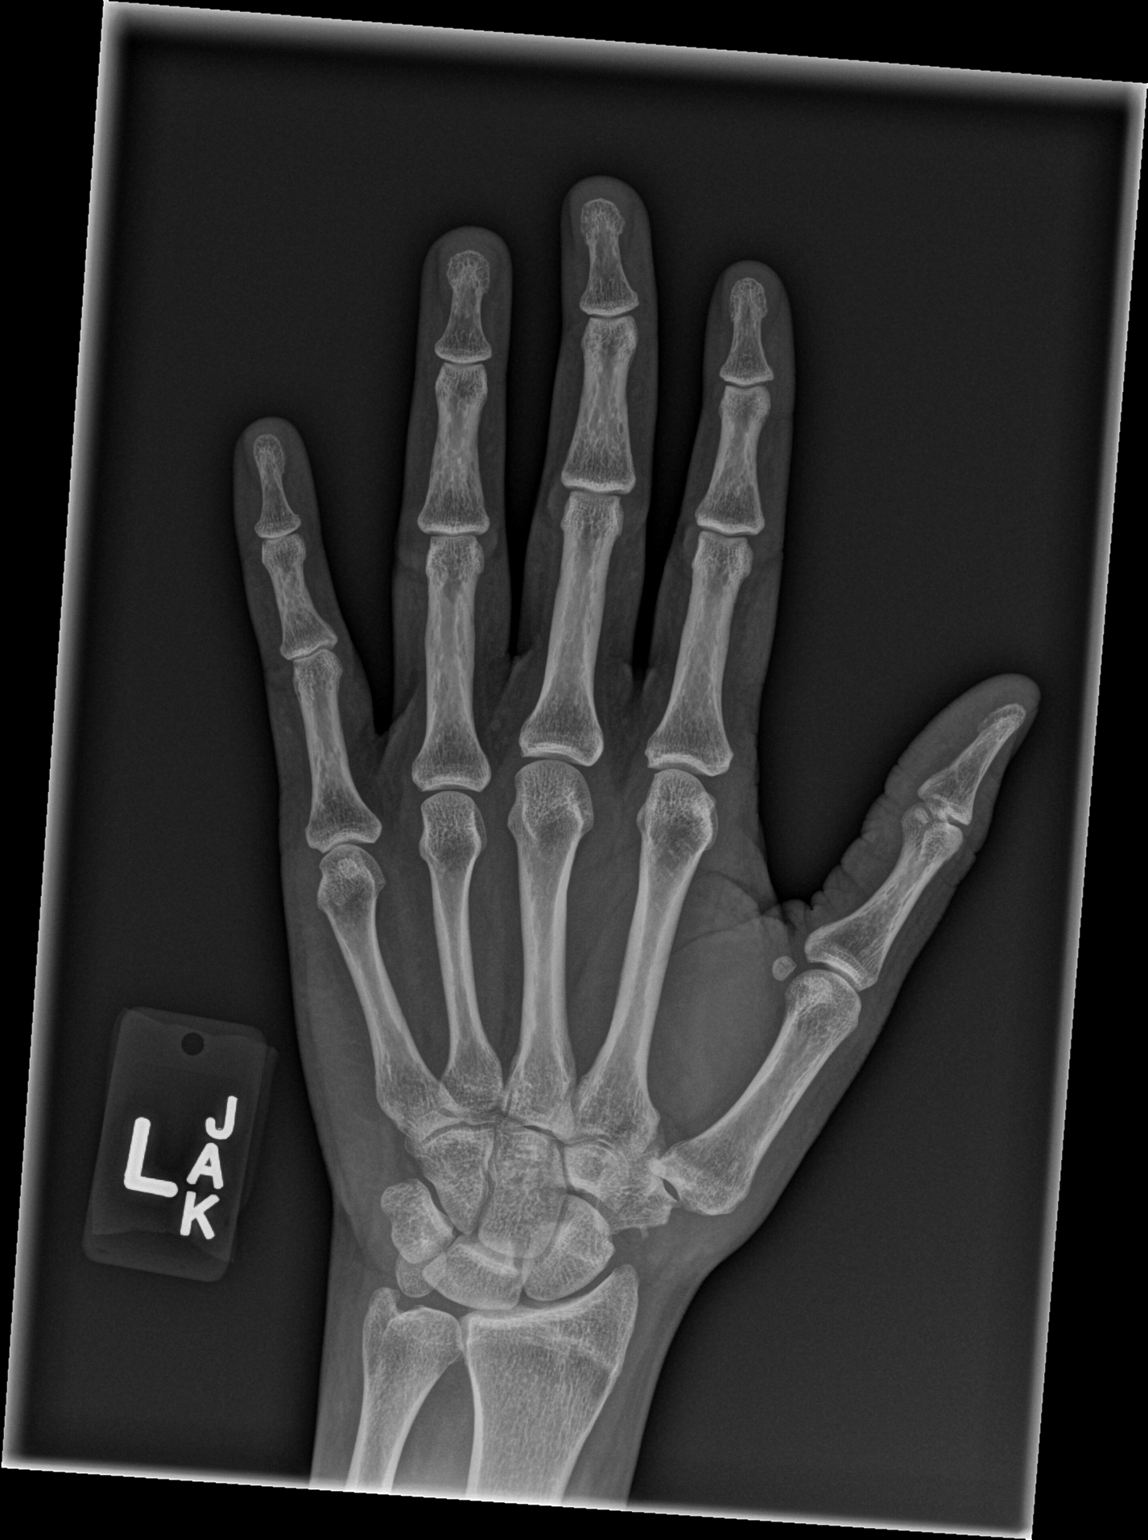

[x hand obl left]
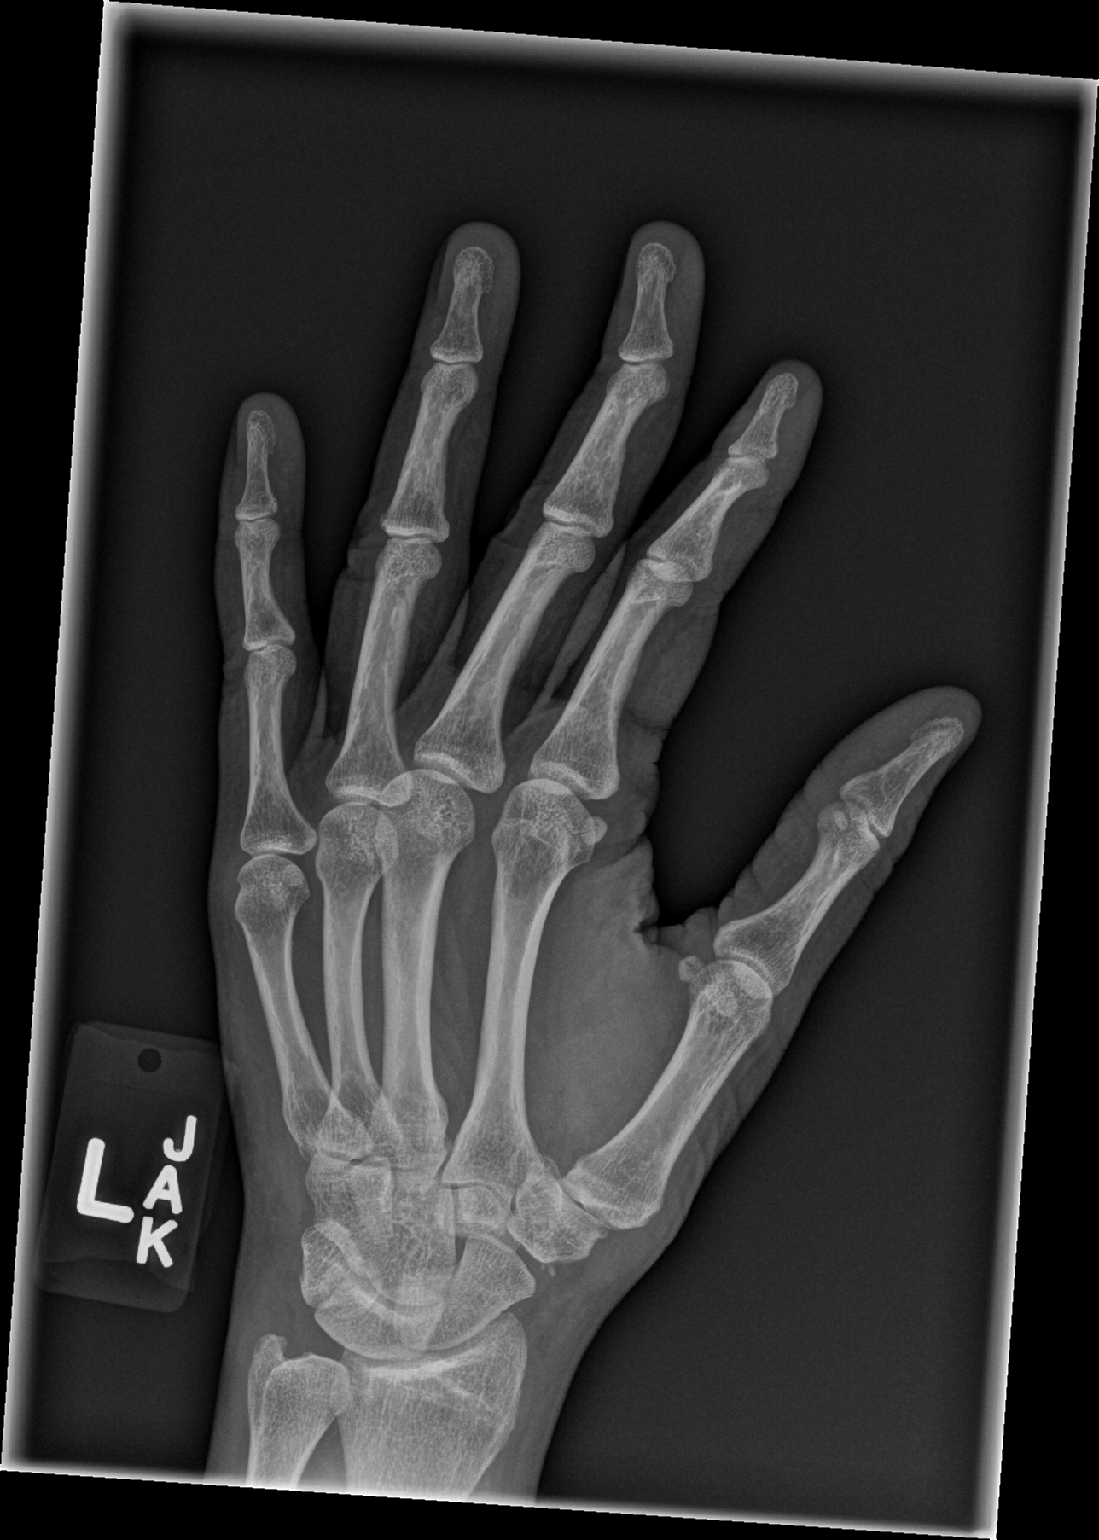

[x hand lat left]
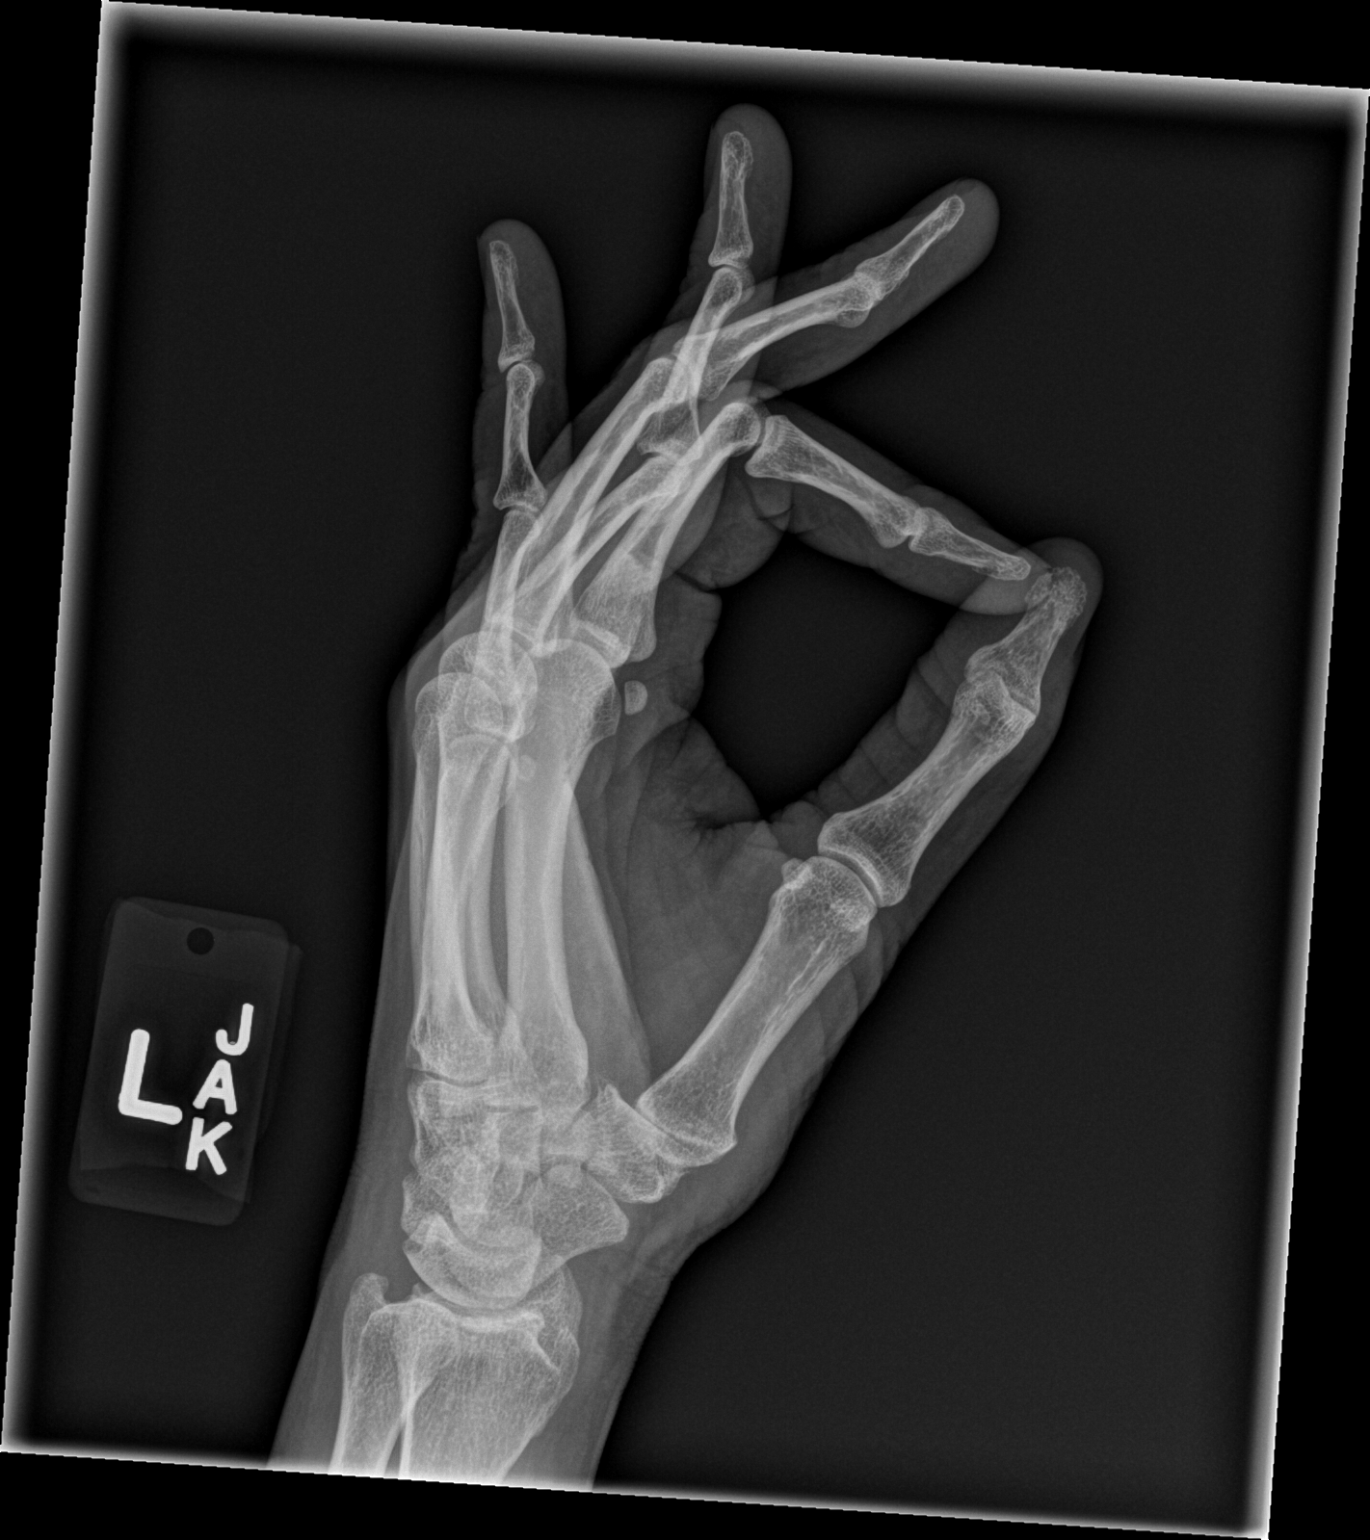

[3 of 3 positions shown; findings below may reference images not displayed]

FINDINGS: Degenerative changes first carpal metacarpal and radiocarpal joints.
Tiny bony densities noted adjacent to the trapezium, possibly tiny
old fracture fragments. No evidence of erosive arthropathy. No acute
bony or joint abnormality. No radiopaque foreign body.
IMPRESSION: Degenerative changes first carpometacarpal and radiocarpal joints.
Tiny bony densities noted adjacent to the trapezium, possibly tiny
old fracture fragments. No evidence of inflammatory arthropathy. No
acute abnormality identified.

## 2021-12-10 ENCOUNTER — Other Ambulatory Visit: Payer: Self-pay | Admitting: Obstetrics and Gynecology

## 2021-12-10 DIAGNOSIS — Z1231 Encounter for screening mammogram for malignant neoplasm of breast: Secondary | ICD-10-CM

## 2021-12-30 ENCOUNTER — Ambulatory Visit
Admission: RE | Admit: 2021-12-30 | Discharge: 2021-12-30 | Disposition: A | Discharge status: Home / Self Care | Payer: Federal, State, Local not specified - PPO | Source: Ambulatory Visit | Attending: Obstetrics and Gynecology | Admitting: Obstetrics and Gynecology

## 2021-12-30 DIAGNOSIS — Z1231 Encounter for screening mammogram for malignant neoplasm of breast: Secondary | ICD-10-CM

## 2022-01-03 ENCOUNTER — Other Ambulatory Visit: Payer: Self-pay | Admitting: Obstetrics and Gynecology

## 2022-01-03 DIAGNOSIS — R928 Other abnormal and inconclusive findings on diagnostic imaging of breast: Secondary | ICD-10-CM

## 2022-01-13 ENCOUNTER — Ambulatory Visit
Admission: RE | Admit: 2022-01-13 | Discharge: 2022-01-13 | Disposition: A | Discharge status: Home / Self Care | Payer: Federal, State, Local not specified - PPO | Source: Ambulatory Visit | Attending: Obstetrics and Gynecology | Admitting: Obstetrics and Gynecology

## 2022-01-13 ENCOUNTER — Other Ambulatory Visit: Payer: Self-pay | Admitting: Obstetrics and Gynecology

## 2022-01-13 DIAGNOSIS — R928 Other abnormal and inconclusive findings on diagnostic imaging of breast: Secondary | ICD-10-CM

## 2022-01-13 DIAGNOSIS — N6310 Unspecified lump in the right breast, unspecified quadrant: Secondary | ICD-10-CM

## 2022-02-23 ENCOUNTER — Ambulatory Visit: Payer: Federal, State, Local not specified - PPO | Attending: Orthopedic Surgery | Admitting: Physical Therapy

## 2022-02-23 ENCOUNTER — Encounter: Payer: Self-pay | Admitting: Physical Therapy

## 2022-02-23 DIAGNOSIS — M25562 Pain in left knee: Secondary | ICD-10-CM | POA: Insufficient documentation

## 2022-02-23 DIAGNOSIS — M25561 Pain in right knee: Secondary | ICD-10-CM | POA: Diagnosis present

## 2022-02-23 DIAGNOSIS — M6281 Muscle weakness (generalized): Secondary | ICD-10-CM | POA: Diagnosis not present

## 2022-02-23 DIAGNOSIS — G8929 Other chronic pain: Secondary | ICD-10-CM | POA: Diagnosis present

## 2022-02-23 DIAGNOSIS — R262 Difficulty in walking, not elsewhere classified: Secondary | ICD-10-CM

## 2022-02-23 DIAGNOSIS — R278 Other lack of coordination: Secondary | ICD-10-CM | POA: Diagnosis present
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
## 2022-02-25 ENCOUNTER — Ambulatory Visit: Payer: Federal, State, Local not specified - PPO | Attending: Orthopedic Surgery | Admitting: Physical Therapy

## 2022-02-25 ENCOUNTER — Encounter: Payer: Self-pay | Admitting: Physical Therapy

## 2022-02-25 DIAGNOSIS — M25562 Pain in left knee: Secondary | ICD-10-CM | POA: Insufficient documentation

## 2022-02-25 DIAGNOSIS — M6281 Muscle weakness (generalized): Secondary | ICD-10-CM | POA: Diagnosis present

## 2022-02-25 DIAGNOSIS — M25561 Pain in right knee: Secondary | ICD-10-CM | POA: Insufficient documentation

## 2022-02-25 DIAGNOSIS — R262 Difficulty in walking, not elsewhere classified: Secondary | ICD-10-CM | POA: Diagnosis present

## 2022-02-25 DIAGNOSIS — G8929 Other chronic pain: Secondary | ICD-10-CM | POA: Diagnosis present

## 2022-02-25 DIAGNOSIS — R278 Other lack of coordination: Secondary | ICD-10-CM | POA: Diagnosis present
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
## 2022-03-01 ENCOUNTER — Ambulatory Visit: Payer: Federal, State, Local not specified - PPO | Admitting: Physical Therapy

## 2022-03-04 ENCOUNTER — Ambulatory Visit: Payer: Federal, State, Local not specified - PPO | Admitting: Physical Therapy

## 2022-03-08 ENCOUNTER — Ambulatory Visit: Payer: Federal, State, Local not specified - PPO | Admitting: Physical Therapy

## 2022-03-08 ENCOUNTER — Encounter: Payer: Self-pay | Admitting: Physical Therapy

## 2022-03-08 DIAGNOSIS — M6281 Muscle weakness (generalized): Secondary | ICD-10-CM

## 2022-03-08 DIAGNOSIS — R262 Difficulty in walking, not elsewhere classified: Secondary | ICD-10-CM

## 2022-03-08 DIAGNOSIS — R278 Other lack of coordination: Secondary | ICD-10-CM

## 2022-03-08 DIAGNOSIS — G8929 Other chronic pain: Secondary | ICD-10-CM
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
## 2022-03-11 ENCOUNTER — Ambulatory Visit: Payer: Federal, State, Local not specified - PPO | Admitting: Physical Therapy

## 2022-03-11 ENCOUNTER — Encounter: Payer: Self-pay | Admitting: Physical Therapy

## 2022-03-11 DIAGNOSIS — G8929 Other chronic pain: Secondary | ICD-10-CM

## 2022-03-11 DIAGNOSIS — R278 Other lack of coordination: Secondary | ICD-10-CM

## 2022-03-11 DIAGNOSIS — R262 Difficulty in walking, not elsewhere classified: Secondary | ICD-10-CM

## 2022-03-11 DIAGNOSIS — M6281 Muscle weakness (generalized): Secondary | ICD-10-CM | POA: Diagnosis not present
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
## 2022-03-14 ENCOUNTER — Encounter: Payer: Self-pay | Admitting: Physical Therapy

## 2022-03-14 ENCOUNTER — Ambulatory Visit: Payer: Federal, State, Local not specified - PPO | Admitting: Physical Therapy

## 2022-03-14 DIAGNOSIS — G8929 Other chronic pain: Secondary | ICD-10-CM

## 2022-03-14 DIAGNOSIS — M6281 Muscle weakness (generalized): Secondary | ICD-10-CM

## 2022-03-14 DIAGNOSIS — R278 Other lack of coordination: Secondary | ICD-10-CM

## 2022-03-14 DIAGNOSIS — R262 Difficulty in walking, not elsewhere classified: Secondary | ICD-10-CM
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
## 2022-03-18 ENCOUNTER — Encounter: Payer: Self-pay | Admitting: Physical Therapy

## 2022-03-18 ENCOUNTER — Ambulatory Visit: Payer: Federal, State, Local not specified - PPO | Admitting: Physical Therapy

## 2022-03-18 DIAGNOSIS — M6281 Muscle weakness (generalized): Secondary | ICD-10-CM | POA: Diagnosis not present

## 2022-03-18 DIAGNOSIS — G8929 Other chronic pain: Secondary | ICD-10-CM

## 2022-03-18 DIAGNOSIS — R262 Difficulty in walking, not elsewhere classified: Secondary | ICD-10-CM
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
## 2022-03-25 ENCOUNTER — Ambulatory Visit: Payer: Federal, State, Local not specified - PPO | Admitting: Physical Therapy

## 2022-03-25 DIAGNOSIS — R262 Difficulty in walking, not elsewhere classified: Secondary | ICD-10-CM

## 2022-03-25 DIAGNOSIS — G8929 Other chronic pain: Secondary | ICD-10-CM

## 2022-03-25 DIAGNOSIS — M6281 Muscle weakness (generalized): Secondary | ICD-10-CM
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
## 2022-03-30 ENCOUNTER — Encounter: Payer: Self-pay | Admitting: Physical Therapy

## 2022-03-30 ENCOUNTER — Ambulatory Visit: Payer: Federal, State, Local not specified - PPO | Attending: Orthopedic Surgery | Admitting: Physical Therapy

## 2022-03-30 DIAGNOSIS — M25561 Pain in right knee: Secondary | ICD-10-CM | POA: Diagnosis present

## 2022-03-30 DIAGNOSIS — M6281 Muscle weakness (generalized): Secondary | ICD-10-CM | POA: Diagnosis present

## 2022-03-30 DIAGNOSIS — R262 Difficulty in walking, not elsewhere classified: Secondary | ICD-10-CM | POA: Insufficient documentation

## 2022-03-30 DIAGNOSIS — R278 Other lack of coordination: Secondary | ICD-10-CM

## 2022-03-30 DIAGNOSIS — M25562 Pain in left knee: Secondary | ICD-10-CM | POA: Diagnosis present

## 2022-03-30 DIAGNOSIS — G8929 Other chronic pain: Secondary | ICD-10-CM | POA: Insufficient documentation
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
## 2022-04-01 ENCOUNTER — Encounter: Payer: Self-pay | Admitting: Physical Therapy

## 2022-04-01 ENCOUNTER — Ambulatory Visit: Payer: Federal, State, Local not specified - PPO | Admitting: Physical Therapy

## 2022-04-01 DIAGNOSIS — R278 Other lack of coordination: Secondary | ICD-10-CM

## 2022-04-01 DIAGNOSIS — R262 Difficulty in walking, not elsewhere classified: Secondary | ICD-10-CM

## 2022-04-01 DIAGNOSIS — M6281 Muscle weakness (generalized): Secondary | ICD-10-CM | POA: Diagnosis not present

## 2022-04-01 DIAGNOSIS — G8929 Other chronic pain: Secondary | ICD-10-CM
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
## 2022-04-08 ENCOUNTER — Ambulatory Visit: Payer: Federal, State, Local not specified - PPO | Admitting: Physical Therapy

## 2022-04-13 ENCOUNTER — Ambulatory Visit: Payer: Federal, State, Local not specified - PPO | Admitting: Physical Therapy

## 2022-04-13 ENCOUNTER — Encounter: Payer: Self-pay | Admitting: Physical Therapy

## 2022-04-13 DIAGNOSIS — M6281 Muscle weakness (generalized): Secondary | ICD-10-CM

## 2022-04-13 DIAGNOSIS — R278 Other lack of coordination: Secondary | ICD-10-CM

## 2022-04-13 DIAGNOSIS — R262 Difficulty in walking, not elsewhere classified: Secondary | ICD-10-CM

## 2022-04-13 DIAGNOSIS — G8929 Other chronic pain: Secondary | ICD-10-CM
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
## 2022-04-14 DIAGNOSIS — N952 Postmenopausal atrophic vaginitis: Secondary | ICD-10-CM | POA: Insufficient documentation

## 2022-04-15 ENCOUNTER — Ambulatory Visit: Payer: Federal, State, Local not specified - PPO | Admitting: Physical Therapy

## 2022-04-20 ENCOUNTER — Ambulatory Visit: Payer: Federal, State, Local not specified - PPO | Admitting: Physical Therapy

## 2022-04-22 ENCOUNTER — Ambulatory Visit: Payer: Federal, State, Local not specified - PPO | Admitting: Physical Therapy

## 2022-07-19 ENCOUNTER — Other Ambulatory Visit: Payer: Federal, State, Local not specified - PPO

## 2022-08-18 ENCOUNTER — Other Ambulatory Visit: Payer: Federal, State, Local not specified - PPO

## 2022-09-01 ENCOUNTER — Ambulatory Visit
Admission: RE | Admit: 2022-09-01 | Discharge: 2022-09-01 | Disposition: A | Discharge status: Home / Self Care | Payer: Federal, State, Local not specified - PPO | Source: Ambulatory Visit | Attending: Obstetrics and Gynecology | Admitting: Obstetrics and Gynecology

## 2022-09-01 DIAGNOSIS — N6310 Unspecified lump in the right breast, unspecified quadrant: Secondary | ICD-10-CM

## 2022-09-09 ENCOUNTER — Other Ambulatory Visit: Payer: Self-pay | Admitting: Obstetrics and Gynecology

## 2022-09-09 DIAGNOSIS — N6310 Unspecified lump in the right breast, unspecified quadrant: Secondary | ICD-10-CM

## 2022-09-22 ENCOUNTER — Encounter: Payer: Self-pay | Admitting: Internal Medicine

## 2022-12-21 IMAGING — MG MM DIGITAL SCREENING BILAT W/ TOMO AND CAD
8 series · 8 of 24 positions shown · non-contrast
Comparison: Previous exam(s).

CLINICAL DATA: Screening.

EXAM:
DIGITAL SCREENING BILATERAL MAMMOGRAM WITH TOMOSYNTHESIS AND CAD
TECHNIQUE: Bilateral screening digital craniocaudal and mediolateral oblique
mammograms were obtained. Bilateral screening digital breast
tomosynthesis was performed. The images were evaluated with
computer-aided detection.

[L CC synth-2D]
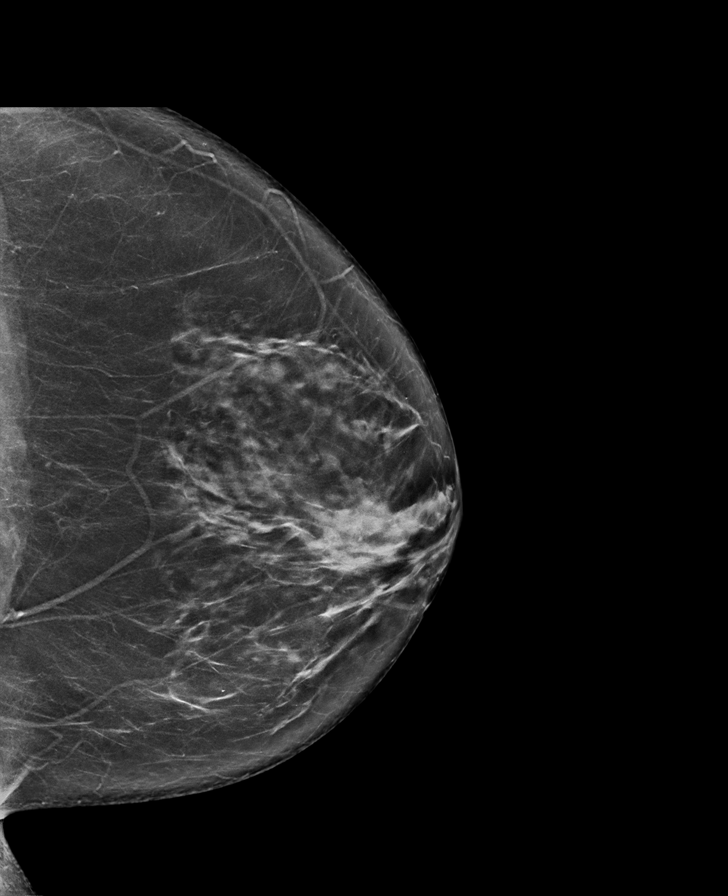

[L MLO synth-2D]
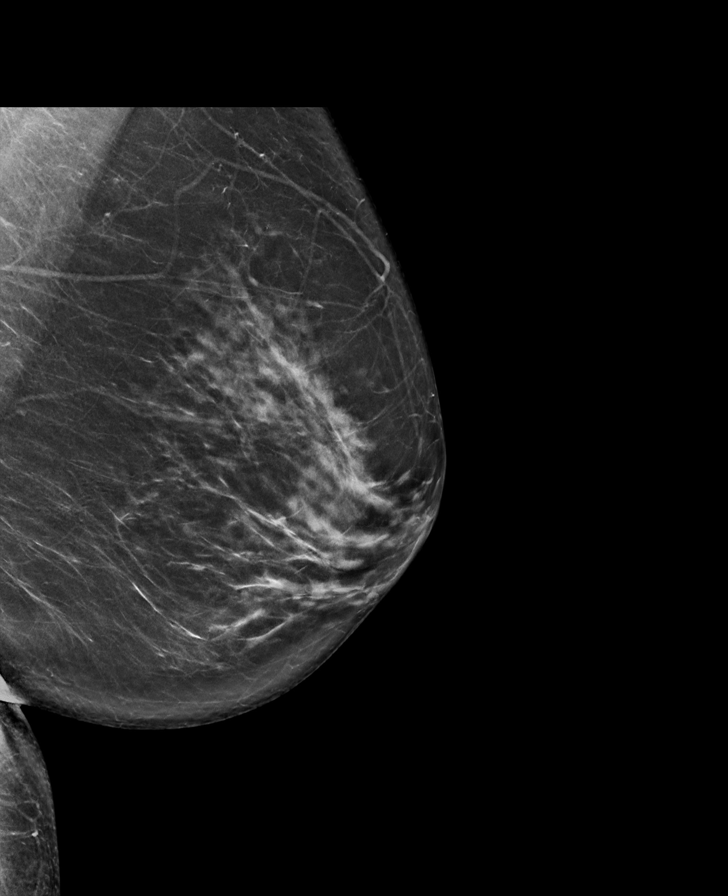

[R CC synth-2D]
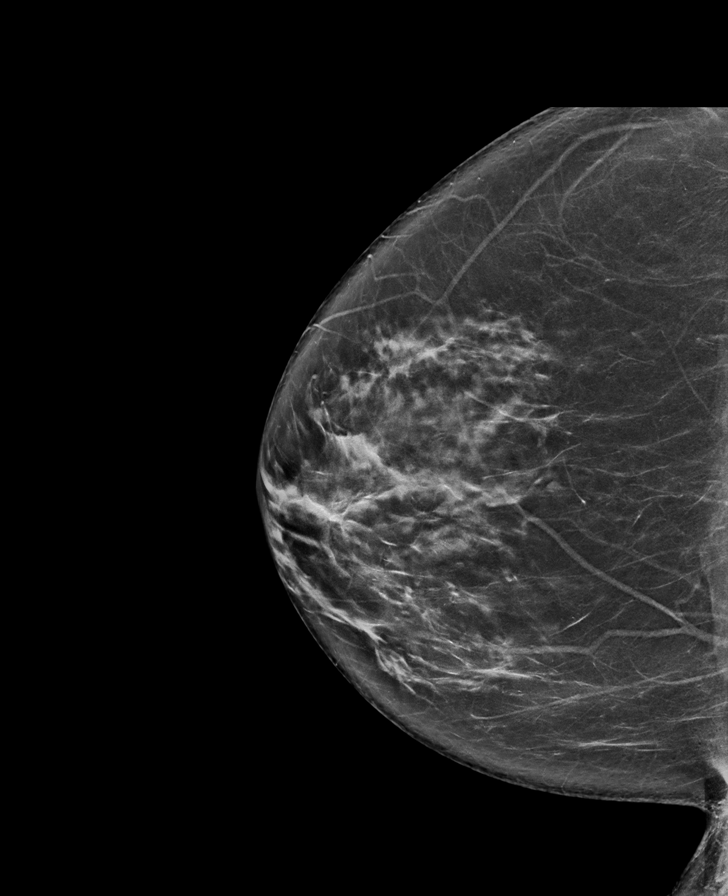

[R MLO synth-2D]
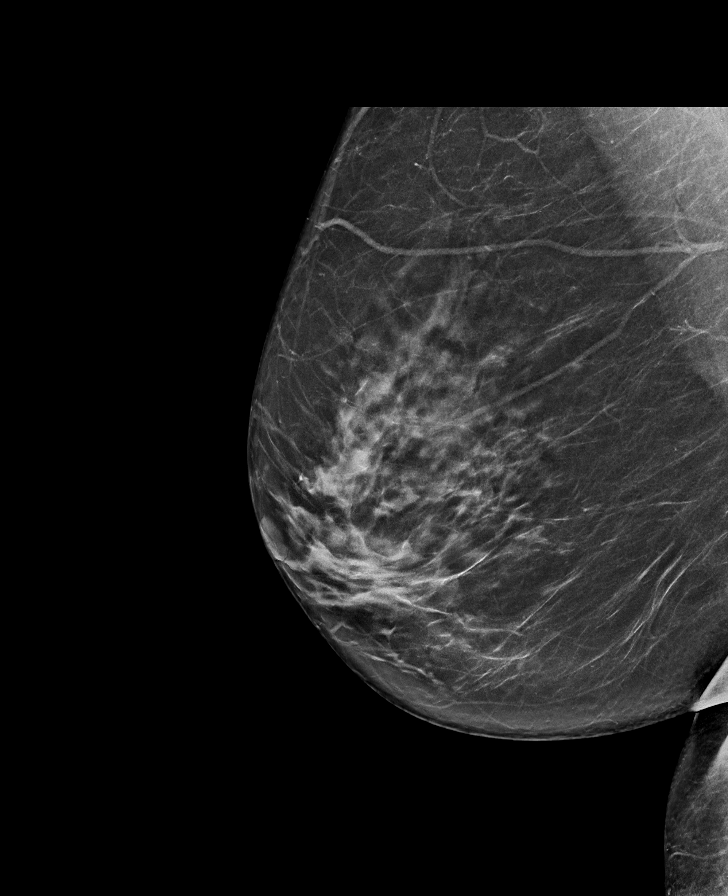

[L CC tomo · tomo slice 41/80.0]
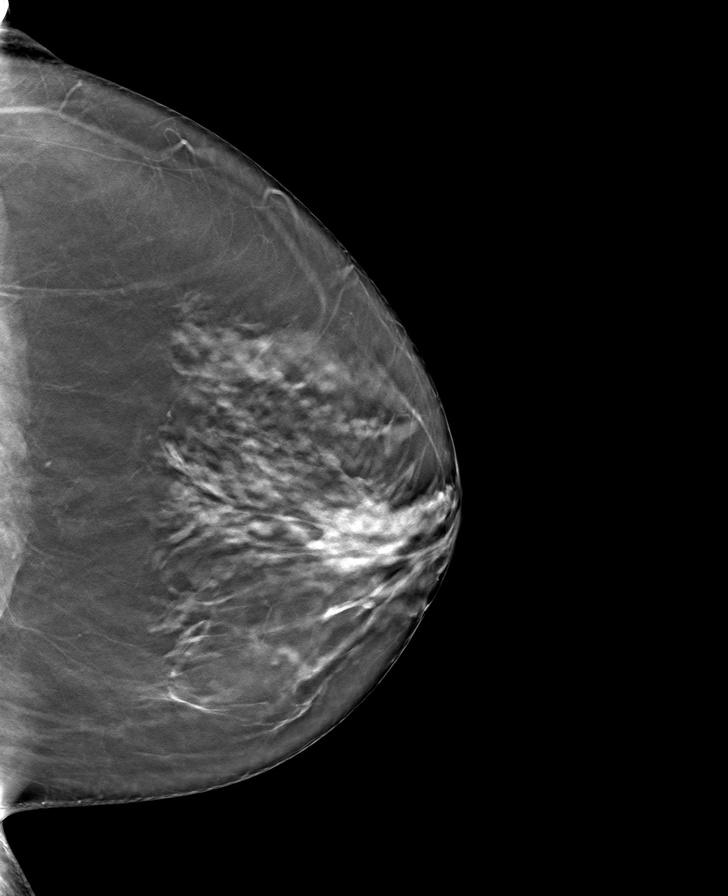

[R MLO tomo · tomo slice 39/78.0]
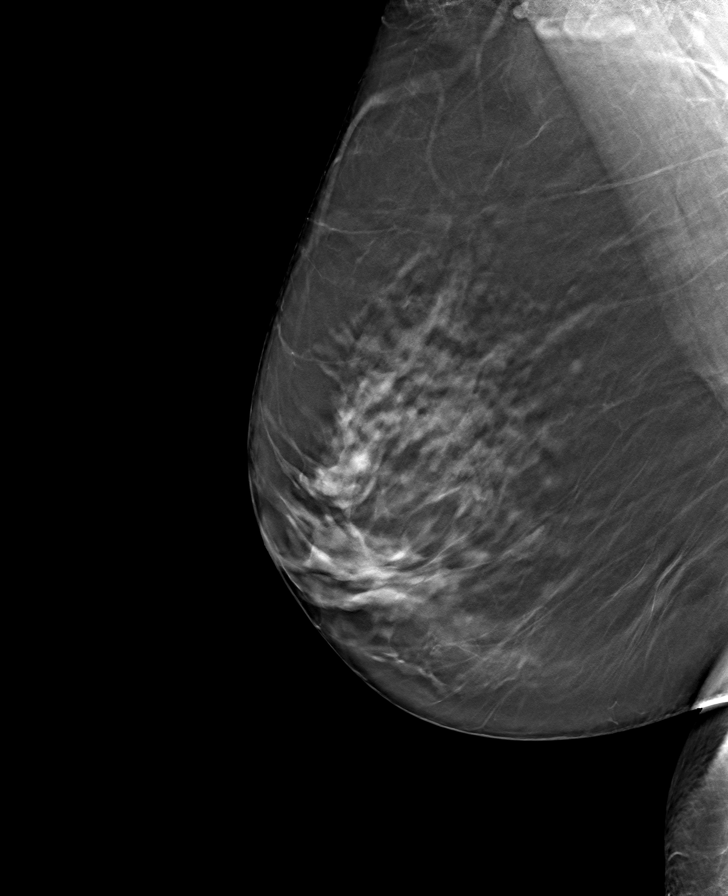

[L MLO tomo · tomo slice 40/79.0]
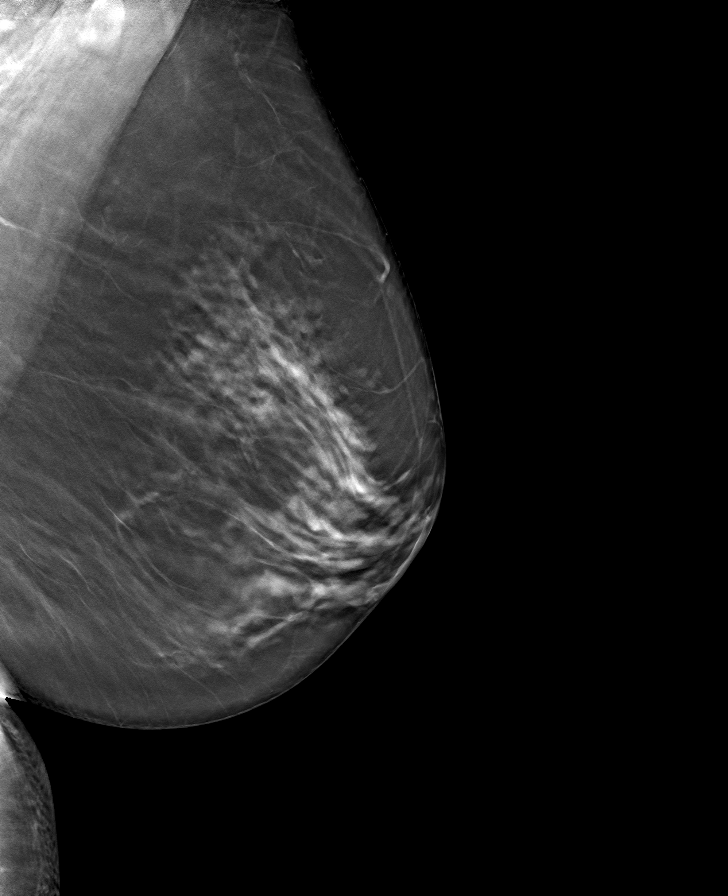

[R CC tomo · tomo slice 41/80.0]
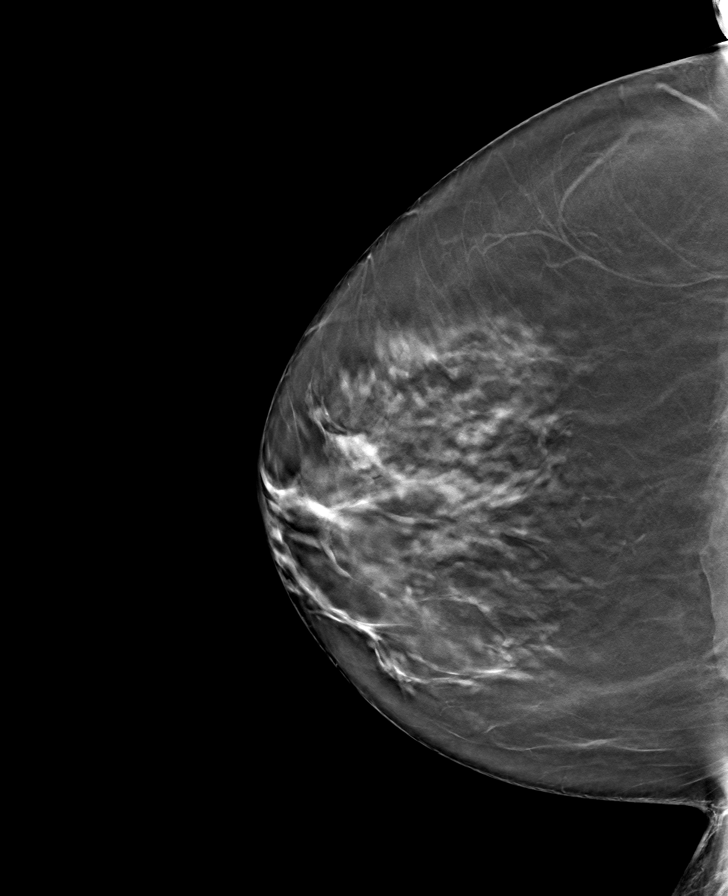

[8 of 24 positions shown; findings below may reference images not displayed]

ACR Breast Density Category c: The breast tissue is heterogeneously
dense, which may obscure small masses.
FINDINGS: There are no findings suspicious for malignancy.
IMPRESSION: No mammographic evidence of malignancy. A result letter of this
screening mammogram will be mailed directly to the patient.

RECOMMENDATION:
Screening mammogram in one year. (Code:Q3-W-BC3)

BI-RADS CATEGORY  1: Negative.

## 2023-02-20 ENCOUNTER — Encounter: Payer: Self-pay | Admitting: Obstetrics and Gynecology

## 2023-03-16 DIAGNOSIS — L219 Seborrheic dermatitis, unspecified: Secondary | ICD-10-CM | POA: Insufficient documentation

## 2023-03-16 DIAGNOSIS — L658 Other specified nonscarring hair loss: Secondary | ICD-10-CM | POA: Insufficient documentation

## 2023-04-11 ENCOUNTER — Other Ambulatory Visit: Payer: Federal, State, Local not specified - PPO

## 2023-04-17 ENCOUNTER — Encounter: Payer: Self-pay | Admitting: Internal Medicine

## 2023-04-25 ENCOUNTER — Other Ambulatory Visit: Payer: Federal, State, Local not specified - PPO

## 2023-05-10 ENCOUNTER — Ambulatory Visit
Admission: RE | Admit: 2023-05-10 | Discharge: 2023-05-10 | Disposition: A | Discharge status: Home / Self Care | Payer: Federal, State, Local not specified - PPO | Source: Ambulatory Visit | Attending: Obstetrics and Gynecology | Admitting: Obstetrics and Gynecology

## 2023-05-10 DIAGNOSIS — N6310 Unspecified lump in the right breast, unspecified quadrant: Secondary | ICD-10-CM

## 2023-05-15 DIAGNOSIS — E663 Overweight: Secondary | ICD-10-CM | POA: Insufficient documentation

## 2023-05-15 DIAGNOSIS — N951 Menopausal and female climacteric states: Secondary | ICD-10-CM | POA: Insufficient documentation

## 2023-05-26 ENCOUNTER — Ambulatory Visit: Payer: Federal, State, Local not specified - PPO

## 2023-05-26 VITALS — Ht 66.5 in | Wt 161.0 lb

## 2023-05-26 DIAGNOSIS — Z800 Family history of malignant neoplasm of digestive organs: Secondary | ICD-10-CM

## 2023-05-26 MED ORDER — SUFLAVE 178.7 G PO SOLR: 1.0000 | Freq: Once | ORAL | 0 refills | Status: AC

## 2023-05-26 NOTE — Progress Notes (Signed)
 No egg or soy allergy known to patient  No issues known to pt with past sedation with any surgeries or procedures Patient denies ever being told they had issues or difficulty with intubation  No FH of Malignant Hyperthermia Pt is not on diet pills Pt is not on  home 02  Pt is not on blood thinners  Pt denies issues with constipation  No A fib or A flutter Have any cardiac testing pending--no Ambulates independently

## 2023-06-19 ENCOUNTER — Encounter: Payer: Self-pay | Admitting: Internal Medicine

## 2023-06-19 ENCOUNTER — Ambulatory Visit (AMBULATORY_SURGERY_CENTER): Payer: Federal, State, Local not specified - PPO | Admitting: Internal Medicine

## 2023-06-19 VITALS — BP 128/68 | HR 64 | Temp 97.3°F | Resp 16 | Ht 66.0 in | Wt 161.0 lb

## 2023-06-19 DIAGNOSIS — Z800 Family history of malignant neoplasm of digestive organs: Secondary | ICD-10-CM

## 2023-06-19 DIAGNOSIS — Z1211 Encounter for screening for malignant neoplasm of colon: Secondary | ICD-10-CM | POA: Diagnosis present

## 2023-06-19 DIAGNOSIS — K648 Other hemorrhoids: Secondary | ICD-10-CM | POA: Diagnosis not present

## 2023-06-19 MED ORDER — SODIUM CHLORIDE 0.9 % IV SOLN: 500.0000 mL | Freq: Once | INTRAVENOUS | Status: DC

## 2023-06-19 NOTE — Progress Notes (Signed)
 Pt's states no medical or surgical changes since previsit or office visit.

## 2023-06-19 NOTE — Op Note (Signed)
 [facility] Patient Name: Carla Jacobson Procedure Date: 06/19/2023 12:30 PM MRN: [id number] Endoscopist: Wilhemina Bonito. Marina Goodell , MD, [id number] Age: 58 Referring MD:  Date of Birth: [birth year] Gender: Female Account #: [id number] Procedure:                Colonoscopy Indications:              Screening for colorectal malignant neoplasm Medicines:                Monitored Anesthesia Care Procedure:                Pre-Anesthesia Assessment:                           - Prior to the procedure, a History and Physical                            was performed, and patient medications and                            allergies were reviewed. The patient's tolerance of                            previous anesthesia was also reviewed. The risks                            and benefits of the procedure and the sedation                            options and risks were discussed with the patient.                            All questions were answered, and informed consent                            was obtained. Prior Anticoagulants: The patient has                            taken no anticoagulant or antiplatelet agents. ASA                            Grade Assessment: II - A patient with mild systemic                            disease. After reviewing the risks and benefits,                            the patient was deemed in satisfactory condition to                            undergo the procedure.                           After obtaining informed consent, the colonoscope  was passed under direct vision. Throughout the                            procedure, the patient's blood pressure, pulse, and                            oxygen saturations were monitored continuously. The                            Olympus Scope SN: [id number] was introduced through                            the anus and advanced to the the cecum, identified                            by  appendiceal orifice and ileocecal valve. The                            ileocecal valve, appendiceal orifice, and rectum                            were photographed. The quality of the bowel                            preparation was excellent. The colonoscopy was                            performed without difficulty. The patient tolerated                            the procedure well. The bowel preparation used was                            SUPREP via split dose instruction. Scope In: 12:45:23 PM Scope Out: 12:56:32 PM Scope Withdrawal Time: 0 hours 8 minutes 43 seconds  Total Procedure Duration: 0 hours 11 minutes 9 seconds  Findings:                 The entire examined colon appeared normal on direct                            and retroflexion views. Small internal hemorrhoids Complications:            No immediate complications. Estimated blood loss:                            None. Estimated Blood Loss:     Estimated blood loss: none. Impression:               - The entire examined colon is normal on direct and                            retroflexion views.                           -  Small internal hemorrhoids. Recommendation:           - Repeat colonoscopy in 10 years for screening                            purposes.                           - Patient has a contact number available for                            emergencies. The signs and symptoms of potential                            delayed complications were discussed with the                            patient. Return to normal activities tomorrow.                            Written discharge instructions were provided to the                            patient.                           - Resume previous diet.                           - Continue present medications. Wilhemina Bonito. Marina Goodell, MD 06/19/2023 1:02:00 PM This report has been signed electronically.

## 2023-06-19 NOTE — Progress Notes (Signed)
 A/O x 3, gd SR's, VSS, report to RN

## 2023-06-19 NOTE — Progress Notes (Signed)
 HISTORY OF PRESENT ILLNESS:  Carla Jacobson is a 58 y.o. female sent for routine screening colonoscopy.  Previous exam of 2014 was normal  REVIEW OF SYSTEMS:  All non-GI ROS negative except for  Past Medical History:  Diagnosis Date   Allergy    Environmental allergies    Fasciitis    Headache(784.0)    Hypertension     Past Surgical History:  Procedure Laterality Date   BELPHAROPTOSIS REPAIR     r/eye   COLONOSCOPY     KNEE SURGERY     r/knee    Social History Carla Jacobson  reports that she has never smoked. She has never used smokeless tobacco. She reports current alcohol use. She reports that she does not use drugs.  family history includes Breast cancer in her paternal aunt; Colon polyps in her mother; Diabetes in her mother; Hypertension in her father.  Allergies  Allergen Reactions   Meloxicam Other (See Comments)    Mouth sores       PHYSICAL EXAMINATION: Vital signs: BP 117/68   Pulse 69   Temp (!) 97.3 F (36.3 C) (Temporal)   Resp 17   Ht 5\' 6"  (1.676 m)   Wt 161 lb (73 kg)   LMP 03/21/2012   SpO2 99%   BMI 25.99 kg/m  General: Well-developed, well-nourished, no acute distress HEENT: Sclerae are anicteric, conjunctiva pink. Oral mucosa intact Lungs: Clear Heart: Regular Abdomen: soft, nontender, nondistended, no obvious ascites, no peritoneal signs, normal bowel sounds. No organomegaly. Extremities: No edema Psychiatric: alert and oriented x3. Cooperative     ASSESSMENT:   Colon cancer screening  PLAN:   Screening colonoscopy

## 2023-06-19 NOTE — Patient Instructions (Signed)
Resume previous diet Continue present medications There were no colon polyps seen today!  You will need another screening colonoscopy in 10 years, you will receive a letter at that time when you are due for the procedure.    Please call us at [phone] if you have a change in bowel habits, change in family history of colo-rectal cancer, rectal bleeding or other GI concern before that time.  Handouts/information given for hemorrhoids  YOU HAD AN ENDOSCOPIC PROCEDURE TODAY AT THE [facility]:   Refer to the procedure report that was given to you for any specific questions about what was found during the examination.  If the procedure report does not answer your questions, please call your gastroenterologist to clarify.  If you requested that your care partner not be given the details of your procedure findings, then the procedure report has been included in a sealed envelope for you to review at your convenience later.  YOU SHOULD EXPECT: Some feelings of bloating in the abdomen. Passage of more gas than usual.  Walking can help get rid of the air that was put into your GI tract during the procedure and reduce the bloating. If you had a lower endoscopy (such as a colonoscopy or flexible sigmoidoscopy) you may notice spotting of blood in your stool or on the toilet paper. If you underwent a bowel prep for your procedure, you may not have a normal bowel movement for a few days.  Please Note:  You might notice some irritation and congestion in your nose or some drainage.  This is from the oxygen used during your procedure.  There is no need for concern and it should clear up in a day or so.   SYMPTOMS TO REPORT IMMEDIATELY:  Following lower endoscopy (colonoscopy):  Excessive amounts of blood in the stool  Significant tenderness or worsening of abdominal pains  Swelling of the abdomen that is new, acute  Fever of 100F or higher  For urgent or emergent issues, a gastroenterologist  can be reached at any hour by calling [phone]. Do not use MyChart messaging for urgent concerns.   DIET:  We do recommend a small meal at first, but then you may proceed to your regular diet.  Drink plenty of fluids but you should avoid alcoholic beverages for 24 hours.  ACTIVITY:  You should plan to take it easy for the rest of today and you should NOT DRIVE or use heavy machinery until tomorrow (because of the sedation medicines used during the test).    FOLLOW UP: Our staff will call the number listed on your records the next business day following your procedure.  We will call around 7:15- 8:00 am to check on you and address any questions or concerns that you may have regarding the information given to you following your procedure. If we do not reach you, we will leave a message.     SIGNATURES/CONFIDENTIALITY: You and/or your care partner have signed paperwork which will be entered into your electronic medical record.  These signatures attest to the fact that that the information above on your After Visit Summary has been reviewed and is understood.  Full responsibility of the confidentiality of this discharge information lies with you and/or your care-partner.

## 2023-06-20 ENCOUNTER — Telehealth: Payer: Self-pay | Admitting: *Deleted

## 2023-06-20 NOTE — Telephone Encounter (Signed)
  Follow up Call-     06/19/2023   11:42 AM  Call back number  Post procedure Call Back phone  # [phone]  Permission to leave phone message Yes     Patient questions:  Do you have a fever, pain , or abdominal swelling? No. Pain Score  0 *  Have you tolerated food without any problems? Yes.    Have you been able to return to your normal activities? Yes.    Do you have any questions about your discharge instructions: Diet   No. Medications  No. Follow up visit  No.  Do you have questions or concerns about your Care? No.  Actions: * If pain score is 4 or above: No action needed, pain <4.

## 2023-10-26 ENCOUNTER — Ambulatory Visit (INDEPENDENT_AMBULATORY_CARE_PROVIDER_SITE_OTHER)

## 2023-10-26 ENCOUNTER — Ambulatory Visit: Admitting: Podiatry

## 2023-10-26 DIAGNOSIS — M2012 Hallux valgus (acquired), left foot: Secondary | ICD-10-CM | POA: Diagnosis not present

## 2023-10-26 DIAGNOSIS — M2011 Hallux valgus (acquired), right foot: Secondary | ICD-10-CM

## 2023-10-26 DIAGNOSIS — M65971 Unspecified synovitis and tenosynovitis, right ankle and foot: Secondary | ICD-10-CM

## 2023-10-26 DIAGNOSIS — M2010 Hallux valgus (acquired), unspecified foot: Secondary | ICD-10-CM

## 2023-10-26 MED ORDER — DEXAMETHASONE SODIUM PHOSPHATE 120 MG/30ML IJ SOLN
2.0000 mg | Freq: Once | INTRAMUSCULAR | Status: AC
  Administered 2023-10-26: 2 mg via INTRA_ARTICULAR

## 2023-10-26 NOTE — Progress Notes (Signed)
 Subjective:  Patient ID: Carla Jacobson, female    DOB: 1965/05/31,  MRN: [id number] HPI Chief Complaint  Patient presents with   Foot Pain    NP- bunion and pain in bottom ball of feet    58 y.o. female presents with the above complaint.   ROS: Denies fever chills nausea mobic muscle aches pains calf pain back pain chest pain shortness of breath.  Past Medical History:  Diagnosis Date   Allergy    Environmental allergies    Fasciitis    Headache(784.0)    Hypertension    Past Surgical History:  Procedure Laterality Date   BELPHAROPTOSIS REPAIR     r/eye   COLONOSCOPY     KNEE SURGERY     r/knee    Current Outpatient Medications:    augmented betamethasone dipropionate (DIPROLENE-AF) 0.05 % cream, Apply topically as needed., Disp: , Rfl:    cetirizine (ZYRTEC) 10 MG tablet, Take 1 tablet by mouth at bedtime., Disp: , Rfl:    diclofenac Sodium (VOLTAREN) 1 % GEL, Apply topically as needed., Disp: , Rfl:    ergocalciferol (VITAMIN D2) 1.25 MG (50000 UT) capsule, ergocalciferol (vitamin D2) 1,250 mcg (50,000 unit) capsule  TAKE 1 CAPSULE BY MOUTH EVERY WEEK FOR 12 WEEKS, Disp: , Rfl:    estradiol (ESTRACE) 0.1 MG/GM vaginal cream, Insert 1 g every week by vaginal route. (Patient not taking: Reported on 06/19/2023), Disp: , Rfl:    fluticasone (FLONASE) 50 MCG/ACT nasal spray, SPRAY ONCE INTO NOSTRILS DAILY, Disp: , Rfl:    ibuprofen (ADVIL,MOTRIN) 600 MG tablet, Take 600 mg by mouth 2 (two) times daily as needed. pain, Disp: , Rfl:    ketoconazole (NIZORAL) 2 % shampoo, APPLY TO SCALP AND SHAMPOO AS USUAL. REPEAT WEEKLY TO TOLERANCE, Disp: , Rfl:    minoxidil (LONITEN) 2.5 MG tablet, Take 0.5 tablets by mouth daily., Disp: , Rfl:    Naproxen  Sodium (ALEVE ) 220 MG CAPS, , Disp: , Rfl:    triamterene -hydrochlorothiazide (DYAZIDE) 37.5-25 MG capsule, Take 1 capsule by mouth daily., Disp: , Rfl:    UNABLE TO FIND, Take by mouth daily. Med Name: Nutrafol Hair Vitamin, Disp: , Rfl:     UNABLE TO FIND, Take by mouth every other day. Med Name: Omega 3 Crill Oil, Disp: , Rfl:   Allergies  Allergen Reactions   Meloxicam Other (See Comments)    Mouth sores   Review of Systems Objective:  There were no vitals filed for this visit.  General: Well developed, nourished, in no acute distress, alert and oriented x3   Dermatological: Skin is warm, dry and supple bilateral. Nails x 10 are well maintained; remaining integument appears unremarkable at this time. There are no open sores, no preulcerative lesions, no rash or signs of infection present.  Vascular: Dorsalis Pedis artery and Posterior Tibial artery pedal pulses are 2/4 bilateral with immedate capillary fill time. Pedal hair growth present. No varicosities and no lower extremity edema present bilateral.   Neruologic: Grossly intact via light touch bilateral. Vibratory intact via tuning fork bilateral. Protective threshold with Semmes Wienstein monofilament intact to all pedal sites bilateral. Patellar and Achilles deep tendon reflexes 2+ bilateral. No Babinski or clonus noted bilateral.   Musculoskeletal: No gross boney pedal deformities bilateral. No pain, crepitus, or limitation noted with foot and ankle range of motion bilateral. Muscular strength 5/5 in all groups tested bilateral.  Mild hallux abductovalgus deformity bilateral resulting in the transposition of the plantar fat pad of the hallux  to the lateral position.  Right appears to be worse than the left.  She also has pain on palpation and end range of motion of the second metatarsal phalangeal joint of the right foot.    Gait: Unassisted, Nonantalgic.    Radiographs:  Radiographs taken today demonstrate osseously mature foot.  Mild hallux valgus deformities are noted bilaterally right greater than left with a mild cocked up hammertoe deformity second digit right foot.  Assessment & Plan:   Assessment: Hallux abductovalgus deformity bilateral synovitis  capsulitis second metatarsal phalangeal joint with hammertoe deformity second right.  Plan: Discussed etiology pathology conservative versus surgical therapies at this point we discussed  injecting around the second metatarsophalangeal joint dexamethasone  and local anesthetic.  Discussed possible need for upcoming MRI as well as possible hammertoe repair and bunion repair.  Discussed appropriate shoe gear stretching exercise ice therapy shoe gear modifications.  I will follow-up with her on an as-needed basis.  She works for [employer].     Carla Jacobson, [place]

## 2023-10-26 NOTE — Patient Instructions (Signed)
 Last week when she presented with a large left she said 2 weeks ago and right carpal abnormal symptoms at that she was like hysterically laughing like what what what for weird response that she is her last we will have to say sorry patient thinks she
